# Patient Record
Sex: Female | Born: 1935 | Race: White | Hispanic: No | Marital: Married | Smoking: Never smoker
Health system: Southern US, Community
[De-identification: ages and names within clinical notes are randomized; demographics above are authoritative.]

## PROBLEM LIST (undated history)

## (undated) DIAGNOSIS — G459 Transient cerebral ischemic attack, unspecified: Secondary | ICD-10-CM

## (undated) DIAGNOSIS — B0221 Postherpetic geniculate ganglionitis: Secondary | ICD-10-CM

## (undated) DIAGNOSIS — C801 Malignant (primary) neoplasm, unspecified: Secondary | ICD-10-CM

## (undated) DIAGNOSIS — E7800 Pure hypercholesterolemia, unspecified: Secondary | ICD-10-CM

## (undated) DIAGNOSIS — G511 Geniculate ganglionitis: Secondary | ICD-10-CM

---

## 1998-03-21 ENCOUNTER — Other Ambulatory Visit: Admission: RE | Admit: 1998-03-21 | Discharge: 1998-03-21 | Payer: Self-pay | Admitting: Obstetrics and Gynecology

## 1998-04-30 ENCOUNTER — Emergency Department (HOSPITAL_COMMUNITY): Admission: EM | Admit: 1998-04-30 | Discharge: 1998-04-30 | Payer: Self-pay | Admitting: Emergency Medicine

## 1998-04-30 ENCOUNTER — Encounter: Payer: Self-pay | Admitting: Emergency Medicine

## 1998-08-03 ENCOUNTER — Ambulatory Visit (HOSPITAL_COMMUNITY): Admission: RE | Admit: 1998-08-03 | Discharge: 1998-08-03 | Payer: Self-pay | Admitting: Cardiology

## 1998-08-03 ENCOUNTER — Encounter: Payer: Self-pay | Admitting: Cardiology

## 1998-10-24 ENCOUNTER — Emergency Department (HOSPITAL_COMMUNITY): Admission: EM | Admit: 1998-10-24 | Discharge: 1998-10-24 | Payer: Self-pay | Admitting: Emergency Medicine

## 1998-10-25 ENCOUNTER — Emergency Department (HOSPITAL_COMMUNITY): Admission: EM | Admit: 1998-10-25 | Discharge: 1998-10-25 | Payer: Self-pay | Admitting: Emergency Medicine

## 1998-10-28 ENCOUNTER — Emergency Department (HOSPITAL_COMMUNITY): Admission: EM | Admit: 1998-10-28 | Discharge: 1998-10-28 | Payer: Self-pay | Admitting: Emergency Medicine

## 1998-10-29 ENCOUNTER — Emergency Department (HOSPITAL_COMMUNITY): Admission: EM | Admit: 1998-10-29 | Discharge: 1998-10-29 | Payer: Self-pay | Admitting: *Deleted

## 1999-03-08 ENCOUNTER — Emergency Department (HOSPITAL_COMMUNITY): Admission: EM | Admit: 1999-03-08 | Discharge: 1999-03-08 | Payer: Self-pay | Admitting: Emergency Medicine

## 1999-03-08 ENCOUNTER — Ambulatory Visit (HOSPITAL_COMMUNITY): Admission: RE | Admit: 1999-03-08 | Discharge: 1999-03-08 | Payer: Self-pay | Admitting: Emergency Medicine

## 1999-03-08 ENCOUNTER — Encounter: Payer: Self-pay | Admitting: Emergency Medicine

## 1999-03-19 ENCOUNTER — Ambulatory Visit (HOSPITAL_COMMUNITY): Admission: RE | Admit: 1999-03-19 | Discharge: 1999-03-19 | Payer: Self-pay | Admitting: Gastroenterology

## 1999-07-15 ENCOUNTER — Emergency Department (HOSPITAL_COMMUNITY): Admission: EM | Admit: 1999-07-15 | Discharge: 1999-07-15 | Payer: Self-pay | Admitting: Emergency Medicine

## 1999-07-16 ENCOUNTER — Encounter: Payer: Self-pay | Admitting: Emergency Medicine

## 1999-08-07 ENCOUNTER — Ambulatory Visit (HOSPITAL_COMMUNITY): Admission: RE | Admit: 1999-08-07 | Discharge: 1999-08-07 | Payer: Self-pay | Admitting: Neurology

## 1999-10-23 ENCOUNTER — Encounter: Payer: Self-pay | Admitting: Internal Medicine

## 1999-10-23 ENCOUNTER — Encounter: Admission: RE | Admit: 1999-10-23 | Discharge: 1999-10-23 | Payer: Self-pay | Admitting: Internal Medicine

## 2000-08-15 ENCOUNTER — Ambulatory Visit (HOSPITAL_BASED_OUTPATIENT_CLINIC_OR_DEPARTMENT_OTHER): Admission: RE | Admit: 2000-08-15 | Discharge: 2000-08-15 | Payer: Self-pay | Admitting: Plastic Surgery

## 2001-03-20 ENCOUNTER — Encounter: Payer: Self-pay | Admitting: Internal Medicine

## 2001-03-20 ENCOUNTER — Encounter: Admission: RE | Admit: 2001-03-20 | Discharge: 2001-03-20 | Payer: Self-pay | Admitting: Internal Medicine

## 2001-04-14 ENCOUNTER — Other Ambulatory Visit: Admission: RE | Admit: 2001-04-14 | Discharge: 2001-04-14 | Payer: Self-pay | Admitting: Obstetrics and Gynecology

## 2002-03-22 ENCOUNTER — Ambulatory Visit (HOSPITAL_COMMUNITY): Admission: RE | Admit: 2002-03-22 | Discharge: 2002-03-22 | Payer: Self-pay | Admitting: Internal Medicine

## 2002-03-22 ENCOUNTER — Encounter: Payer: Self-pay | Admitting: Internal Medicine

## 2003-03-24 ENCOUNTER — Ambulatory Visit (HOSPITAL_COMMUNITY): Admission: RE | Admit: 2003-03-24 | Discharge: 2003-03-24 | Payer: Self-pay | Admitting: Internal Medicine

## 2003-03-24 ENCOUNTER — Encounter: Payer: Self-pay | Admitting: Internal Medicine

## 2003-05-30 ENCOUNTER — Other Ambulatory Visit: Admission: RE | Admit: 2003-05-30 | Discharge: 2003-05-30 | Payer: Self-pay | Admitting: Obstetrics and Gynecology

## 2004-04-11 ENCOUNTER — Ambulatory Visit (HOSPITAL_COMMUNITY): Admission: RE | Admit: 2004-04-11 | Discharge: 2004-04-11 | Payer: Self-pay | Admitting: Internal Medicine

## 2004-05-03 ENCOUNTER — Ambulatory Visit (HOSPITAL_COMMUNITY): Admission: RE | Admit: 2004-05-03 | Discharge: 2004-05-03 | Payer: Self-pay | Admitting: Gastroenterology

## 2004-05-03 ENCOUNTER — Encounter (INDEPENDENT_AMBULATORY_CARE_PROVIDER_SITE_OTHER): Payer: Self-pay | Admitting: *Deleted

## 2004-06-26 ENCOUNTER — Other Ambulatory Visit: Admission: RE | Admit: 2004-06-26 | Discharge: 2004-06-26 | Payer: Self-pay | Admitting: Obstetrics and Gynecology

## 2007-09-19 ENCOUNTER — Emergency Department (HOSPITAL_COMMUNITY): Admission: EM | Admit: 2007-09-19 | Discharge: 2007-09-19 | Payer: Self-pay | Admitting: Emergency Medicine

## 2008-08-06 ENCOUNTER — Emergency Department (HOSPITAL_COMMUNITY): Admission: EM | Admit: 2008-08-06 | Discharge: 2008-08-06 | Payer: Self-pay | Admitting: Emergency Medicine

## 2009-03-26 ENCOUNTER — Emergency Department (HOSPITAL_COMMUNITY): Admission: EM | Admit: 2009-03-26 | Discharge: 2009-03-26 | Payer: Self-pay | Admitting: Emergency Medicine

## 2009-09-21 ENCOUNTER — Ambulatory Visit: Payer: Self-pay | Admitting: Internal Medicine

## 2009-09-21 ENCOUNTER — Inpatient Hospital Stay (HOSPITAL_COMMUNITY): Admission: EM | Admit: 2009-09-21 | Discharge: 2009-09-23 | Payer: Self-pay | Admitting: Emergency Medicine

## 2009-09-22 ENCOUNTER — Encounter (INDEPENDENT_AMBULATORY_CARE_PROVIDER_SITE_OTHER): Payer: Self-pay | Admitting: Internal Medicine

## 2010-10-19 LAB — COMPREHENSIVE METABOLIC PANEL
Alkaline Phosphatase: 38 U/L — ABNORMAL LOW (ref 39–117)
Calcium: 8.9 mg/dL (ref 8.4–10.5)
Creatinine, Ser: 0.72 mg/dL (ref 0.4–1.2)
GFR calc Af Amer: >60 mL/min (ref >60)
Potassium: 3.1 mEq/L — ABNORMAL LOW (ref 3.5–5.1)
Sodium: 138 mEq/L (ref 135–145)
Total Protein: 6.7 g/dL (ref 6.0–8.3)

## 2010-10-19 LAB — DIFFERENTIAL
Eosinophils Absolute: 0.1 10*3/uL (ref 0.0–0.7)
Monocytes Relative: 7 % (ref 3–12)
Neutro Abs: 3.8 10*3/uL (ref 1.7–7.7)

## 2010-10-19 LAB — CBC
HCT: 36.1 % (ref 36.0–46.0)
Hemoglobin: 12.4 g/dL (ref 12.0–15.0)
MCV: 85.5 fL (ref 78.0–100.0)
RBC: 4.22 MIL/uL (ref 3.87–5.11)
WBC: 5.4 10*3/uL (ref 4.0–10.5)

## 2010-10-19 LAB — PROTIME-INR
INR: 1.11 (ref 0.00–1.49)
Prothrombin Time: 14.2 seconds (ref 11.6–15.2)

## 2010-10-19 LAB — BASIC METABOLIC PANEL
BUN: 8 mg/dL (ref 6–23)
Calcium: 9.1 mg/dL (ref 8.4–10.5)
Creatinine, Ser: 0.7 mg/dL (ref 0.4–1.2)
Glucose, Bld: 104 mg/dL — ABNORMAL HIGH (ref 70–99)

## 2010-10-19 LAB — APTT: aPTT: 35 seconds (ref 24–37)

## 2010-10-19 LAB — URINALYSIS, ROUTINE W REFLEX MICROSCOPIC
Glucose, UA: NEGATIVE mg/dL
Ketones, ur: NEGATIVE mg/dL
Protein, ur: NEGATIVE mg/dL
Specific Gravity, Urine: 1.003 — ABNORMAL LOW (ref 1.005–1.030)
pH: 5.5 (ref 5.0–8.0)

## 2010-10-19 LAB — LIPID PANEL
Cholesterol: 123 mg/dL (ref 0–200)
HDL: 34 mg/dL — ABNORMAL LOW (ref >39)

## 2010-10-19 LAB — CK TOTAL AND CKMB (NOT AT ARMC)
CK, MB: 3.3 ng/mL (ref 0.3–4.0)
Total CK: 172 U/L (ref 7–177)

## 2010-10-19 LAB — HEMOGLOBIN A1C: Mean Plasma Glucose: 120 mg/dL

## 2010-12-14 NOTE — Op Note (Signed)
NAMELAURIEANN, Janet [name]                  ACCOUNT NO.:  [id number]   MEDICAL RECORD NO.:  [id number]          PATIENT TYPE:  AMB   LOCATION:  ENDO                         FACILITY:  [facility]   PHYSICIAN:  Petra Kuba, M.D.    DATE OF BIRTH:  22-Jan-1935   DATE OF PROCEDURE:  05/03/2004  DATE OF DISCHARGE:                                 OPERATIVE REPORT   PROCEDURE:  Colonoscopy with hot biopsy.   INDICATION:  Screening.  Consent was signed after risks, benefits, methods,  and options thoroughly discussed in the office in the past.   MEDICINES USED:  1.  Demerol 60.  2.  Versed 7.   DESCRIPTION OF PROCEDURE:  Rectal inspection was pertinent for external  hemorrhoids, small.  Digital exam was negative.  Video pediatric adjustable  colonoscope was inserted and fairly easily despite a tortuous sigmoid,  advanced to the cecum.  This did require some abdominal pressure but no  position changes.  On insertion, a rare left-sided diverticula was seen but  no other abnormalities.  The cecum was identified by the appendiceal orifice  and the ileocecal valve.  The scope was slowly withdrawn.  The prep was  adequate.  There was some liquid stool that required washing and suctioning.  On slow withdrawal through the colon, the cecum and the ascending were  normal.  In the more proximal transverse, a tiny polyp was seen and was hot  biopsied x 1.  The scope was slowly withdrawn.  Other than the tortuous  sigmoid with an occasional diverticula, no abnormalities were seen.  Once  back in the rectum, anorectal pull-through and retroflexion confirmed the  hemorrhoids.  The scope was straightened and readvanced a short ways up the  left side of the colon.  Air was suctioned, scope removed.  The patient  tolerated the procedure well.  There was no obvious immediate complication.   ENDOSCOPIC DIAGNOSES:  1.  Internal-external hemorrhoids.  2.  Sigmoid diverticulum and tortuosity.  3.  Proximal transverse  tiny polyp, hot biopsied.  4.  Otherwise, within normal limits to the cecum.   PLAN:  1.  Await pathology, but probably recheck on screening if doing well      medically in 5 years.  2.  Continue work-up with an EGD.      MEM/MEDQ  D:  05/03/2004  T:  05/03/2004  Job:  [id number]

## 2010-12-14 NOTE — Op Note (Signed)
[place]  Patient:    Janet [name], Janet [name]                         MRN: [id number] Proc. Date: 08/15/00 Adm. Date:  [id number] Disc. Date: [id number] Attending:  Chapman Moss                           Operative Report  PREOPERATIVE DIAGNOSIS:   Basal cell carcinoma of the right lower lid of 4 mm diameter.  POSTOPERATIVE DIAGNOSES: 1. Basal cell carcinoma of the right lower lid of 4 mm diameter. 2. Complex open wound right lower eye lid.  OPERATION: 1. Excision basal cell carcinoma right lower eyelid 4 mm diameter. 2. Lower eyelid reconstruction of complex open wound greater than    1.0 cm. 3. Lateral lower lid cantholysis.  SURGEON:  Teena Irani. Odis Luster, M.D.  ANESTHESIA:  General with 1% Xylocaine with epinephrine as local.  INDICATIONS:  [age]-year-old woman has a lesion on her lower lid. This is a biopsy-proven basal cell carcinoma.  It is approximately 4 mm in diameter.  The plan was to perform a shield type of excision of this lesion. It was felt that most likely this could be reconstructed since this would involve with margins approximately 25-30% of the lower lid.  A lateral lower limb cantholysis was also planned as a possibility to facilitate the lower lid closure.  The nature of this procedure, the risks and possible complications were discussed with her in detail including the possibility of further surgery being necessitated by any positive margins on the final pathology report.  She wished to proceed.  DESCRIPTION OF PROCEDURE:  The patient was taken to the operating room and placed supine.  After successful induction of anesthesia, she was prepped with Betadine and draped with sterile drapes.  The eyelid was irrigated thoroughly with BSF solution.  A Consormer was then thoroughly washed with BSF and then had Lacri-Lube placed on it and then placed in the eye for protection.  Then 1% Xylocaine with epinephrine local was  then infiltrated after the planned excision was carefully marked under loupe magnification.  The total limits of the excision was approximately 8-9 mm which meant this was somewhere between a 25-30% excision of her lower lid on measurement.  The excision was then performed taking great care to avoid damage to underlying eye.  This was a through and through excision.  The specimen was tagged at its medial border for the pathologist and sent as a permanent specimen.  The wound was irrigated thoroughly and meticulous hemostasis achieved using the needle tip electrocautery.  The closure with 6-0 chromic simple running suture for the conjunctivae keeping the knots in towards the wound itself.  Tarsal plate reapproximated with a 6-0 Prolene horizontal mattress suture.  The remainder of the closure with 6-0 silk interrupted sutures starting at the gray line and working inferiorly.  The first three sutures, the tails were left very long and these were then tied within the lower sutures in order to keep all of the suture material away from her eye. At the conclusion, it was felt that the lower lid closure was just a little bit tight.  She seemed to have to good closure of her lids but it seemed that there was about 1 mm of scleral show.  Due to this tight, a lateral cantholysis of the lower limb of the canthal  pinning was planned.  Then 1% Xylocaine without epinephrine was infiltrated at the lateral canthus.  An incision was made through the skin with a scissor and the underlying canthal tendon was identified.  The lower lid canthal tendon was identified and the upper lid canthal tendon left intact.  The division was then performed by cutting them approximately at 45 degree angle for the lower limb of the canthal tendon only.  This released the lower lid and allowed approximately a 4-5 mm advancement of the lower lid medially.  Thorough irrigation with BSF again and the closure with 6-0 Prolene  simple interrupted sutures.  She tolerated this procedure well.  Polysporin antibiotic ointment was applied to the wounds, and the eye again thoroughly irrigated with saline and some Lacrilube placed in it.  She was transferred to the recovery room in stable condition having tolerated the procedure well.  DISPOSITION:  The patient was checked in the recovery room and was found to have good extraocular eye movements and no visual complaints.  She denies any pain in the globe itself.  PLAN: 1. Darvocet-N 100 mg a total of 50 given one p.o. q.6h. p.r.n. for pain. 2. Polysporin antibiotic ointment for the wounds and Artificial Tears as    needed. 3. I would advise Lacrilube in the eye whenever sleeping. 4. No exercising, and we will recheck her in the office in about 10 days. DD:  08/15/00 TD:  08/17/00 Job: [id number]

## 2010-12-14 NOTE — Op Note (Signed)
[name], [name]                  ACCOUNT NO.:  [id number]   MEDICAL RECORD NO.:  [id number]          PATIENT TYPE:  AMB   LOCATION:  ENDO                         FACILITY:  [facility]   PHYSICIAN:  Petra Kuba, M.D.    DATE OF BIRTH:  16-Jun-1935   DATE OF PROCEDURE:  05/03/2004  DATE OF DISCHARGE:                                 OPERATIVE REPORT   PROCEDURE:  Esophagogastroduodenoscopy.   INDICATIONS:  Increasing upper tract symptoms, not responding to the usual  medicines. Consent was signed after risks, benefits, methods, and options  thoroughly discussed multiple times in the past.   MEDICINES USED:  Demerol 20, Versed 2 mg.   PROCEDURE NOTE:  The video endoscope was inserted by direct vision. Her  esophagus was tortuous. No signs of esophagitis were seen. She did have a  moderately enlarged hiatal hernia, widely patent ring just above it. Scope  passed into the stomach and advanced through a normal antrum and pylorus  into a normal duodenal bulb and around the C-loop to a normal second portion  of the duodenum. Scope was withdrawn back to the bulb, and a good look there  ruled out ulcers in that location. Scope was withdrawn back to the stomach  and retroflexed. The angularis, and lesser and greater curve were normal  except for hiatal hernia being confirmed in the cardia. Straight visualized  in the stomach did not reveal any additional findings. Air was suctioned.  Scope removed. Again, a good look at the esophagus was normal. Scope was  withdrawn. The patient tolerated the procedure well. There was no obvious or  immediate complication.   ENDOSCOPIC DIAGNOSES:  1.  Moderately enlarged hiatal hernia with a widely patent ring and tortuous      esophagus.  2.  Otherwise normal EGD.   PLAN:  Continued pump inhibitors, possibly go over to b.i.d. Happy to see  back p.r.n. Consider rechecking a gallbladder ultrasound just to make sure  gallstones not playing a role with her  symptoms.      MEM/MEDQ  D:  05/03/2004  T:  05/03/2004  Job:  [id number]   cc:   Larina Earthly, M.D.  [address]  [place]  [place] [address]  Fax: [phone]

## 2011-02-25 ENCOUNTER — Emergency Department (HOSPITAL_COMMUNITY)
Admission: EM | Admit: 2011-02-25 | Discharge: 2011-02-25 | Disposition: A | Discharge status: Home / Self Care | Payer: Medicare Other | Attending: Emergency Medicine | Admitting: Emergency Medicine

## 2011-02-25 ENCOUNTER — Emergency Department (HOSPITAL_COMMUNITY): Payer: Medicare Other

## 2011-02-25 DIAGNOSIS — M545 Low back pain, unspecified: Secondary | ICD-10-CM | POA: Insufficient documentation

## 2011-02-25 DIAGNOSIS — E785 Hyperlipidemia, unspecified: Secondary | ICD-10-CM | POA: Insufficient documentation

## 2011-02-25 DIAGNOSIS — X500XXA Overexertion from strenuous movement or load, initial encounter: Secondary | ICD-10-CM | POA: Insufficient documentation

## 2011-02-25 DIAGNOSIS — Z8673 Personal history of transient ischemic attack (TIA), and cerebral infarction without residual deficits: Secondary | ICD-10-CM | POA: Insufficient documentation

## 2011-02-25 DIAGNOSIS — K219 Gastro-esophageal reflux disease without esophagitis: Secondary | ICD-10-CM | POA: Insufficient documentation

## 2011-02-25 DIAGNOSIS — Z79899 Other long term (current) drug therapy: Secondary | ICD-10-CM | POA: Insufficient documentation

## 2011-02-25 DIAGNOSIS — S335XXA Sprain of ligaments of lumbar spine, initial encounter: Secondary | ICD-10-CM | POA: Insufficient documentation

## 2011-02-25 LAB — URINALYSIS, ROUTINE W REFLEX MICROSCOPIC
Bilirubin Urine: NEGATIVE
Nitrite: NEGATIVE
Protein, ur: NEGATIVE mg/dL
Specific Gravity, Urine: 1.008 (ref 1.005–1.030)
pH: 7.5 (ref 5.0–8.0)

## 2011-02-25 LAB — POCT I-STAT, CHEM 8
BUN: 13 mg/dL (ref 6–23)
Calcium, Ion: 1.25 mmol/L (ref 1.12–1.32)
Sodium: 142 mEq/L (ref 135–145)

## 2011-07-12 ENCOUNTER — Emergency Department (HOSPITAL_COMMUNITY): Payer: Medicare Other

## 2011-07-12 ENCOUNTER — Encounter: Payer: Self-pay | Admitting: *Deleted

## 2011-07-12 ENCOUNTER — Emergency Department (HOSPITAL_COMMUNITY)
Admission: EM | Admit: 2011-07-12 | Discharge: 2011-07-12 | Disposition: A | Discharge status: Home / Self Care | Payer: Medicare Other | Attending: Emergency Medicine | Admitting: Emergency Medicine

## 2011-07-12 DIAGNOSIS — G4459 Other complicated headache syndrome: Secondary | ICD-10-CM | POA: Insufficient documentation

## 2011-07-12 DIAGNOSIS — R112 Nausea with vomiting, unspecified: Secondary | ICD-10-CM | POA: Insufficient documentation

## 2011-07-12 DIAGNOSIS — E7800 Pure hypercholesterolemia, unspecified: Secondary | ICD-10-CM | POA: Insufficient documentation

## 2011-07-12 DIAGNOSIS — B0221 Postherpetic geniculate ganglionitis: Secondary | ICD-10-CM | POA: Insufficient documentation

## 2011-07-12 DIAGNOSIS — G459 Transient cerebral ischemic attack, unspecified: Secondary | ICD-10-CM | POA: Insufficient documentation

## 2011-07-12 DIAGNOSIS — F430 Acute stress reaction: Secondary | ICD-10-CM | POA: Insufficient documentation

## 2011-07-12 DIAGNOSIS — Z8673 Personal history of transient ischemic attack (TIA), and cerebral infarction without residual deficits: Secondary | ICD-10-CM | POA: Insufficient documentation

## 2011-07-12 HISTORY — DX: Postherpetic geniculate ganglionitis: B02.21

## 2011-07-12 HISTORY — DX: Geniculate ganglionitis: G51.1

## 2011-07-12 HISTORY — DX: Transient cerebral ischemic attack, unspecified: G45.9

## 2011-07-12 HISTORY — DX: Pure hypercholesterolemia, unspecified: E78.00

## 2011-07-12 MED ORDER — MORPHINE SULFATE 4 MG/ML IJ SOLN
4.0000 mg | Freq: Once | INTRAMUSCULAR | Status: AC
  Administered 2011-07-12: 4 mg via INTRAVENOUS
  Filled 2011-07-12: qty 1

## 2011-07-12 MED ORDER — ONDANSETRON HCL 4 MG/2ML IJ SOLN
4.0000 mg | Freq: Once | INTRAMUSCULAR | Status: AC
  Administered 2011-07-12: 4 mg via INTRAVENOUS
  Filled 2011-07-12 (×2): qty 2

## 2011-07-12 MED ORDER — DIAZEPAM 5 MG PO TABS: 5.0000 mg | ORAL_TABLET | Freq: Two times a day (BID) | ORAL | Status: AC | PRN

## 2011-07-12 MED ORDER — KETOROLAC TROMETHAMINE 30 MG/ML IJ SOLN
INTRAMUSCULAR | Status: AC
  Administered 2011-07-12: 15 mg
  Filled 2011-07-12: qty 1

## 2011-07-12 MED ORDER — KETOROLAC TROMETHAMINE 15 MG/ML IJ SOLN
15.0000 mg | Freq: Once | INTRAMUSCULAR | Status: DC
  Filled 2011-07-12: qty 1

## 2011-07-12 MED ORDER — DIPHENHYDRAMINE HCL 50 MG/ML IJ SOLN
25.0000 mg | Freq: Once | INTRAMUSCULAR | Status: AC
  Administered 2011-07-12: 25 mg via INTRAVENOUS
  Filled 2011-07-12: qty 1

## 2011-07-12 MED ORDER — METOCLOPRAMIDE HCL 5 MG/ML IJ SOLN
10.0000 mg | Freq: Once | INTRAMUSCULAR | Status: AC
  Administered 2011-07-12: 10 mg via INTRAVENOUS
  Filled 2011-07-12: qty 2

## 2011-07-12 NOTE — ED Notes (Signed)
Reports onset n/v, h/a since 1130 progressively worsening. Unable to describe pain. Denies any neuro defecits. No facial droop, slurred speech, dizziness, blurred vision. Report pain across forehead. -photophobia, emesis x1.

## 2011-07-12 NOTE — ED Notes (Signed)
Pt teary eyed, reports is under a lot of stress lately. Family at bedside confirmed

## 2011-07-12 NOTE — ED Notes (Signed)
C/o n/v, h/a onset 2 hrs ago. Pt reports h/a getting worse. Per EMS - IV, zofran 4mg  IV.

## 2011-07-12 NOTE — ED Notes (Signed)
Consulting MD at bedside

## 2011-07-12 NOTE — ED Provider Notes (Addendum)
History    76yF with cc of HA. Gradual onset around 1200 today. Frontal region without radiation. Feels like has been progressively getting worse. Denies trauma. No radiation. No neck pain or stiffness. No fever or chills. Initially nausea and vomited x1. Currently denies nausea but received zofran in route. No visual complaints. No significant HA history. Denies use of blood thinning medication. Denies hx of malignancy.   CSN: [id number] Arrival date & time: 07/12/2011  4:27 PM   First MD Initiated Contact with Patient 07/12/11 1641      Chief Complaint  Patient presents with  . Headache    (Consider location/radiation/quality/duration/timing/severity/associated sxs/prior treatment) HPI  Past Medical History  Diagnosis Date  . TIA (transient ischemic attack)   . High cholesterol   . Neuralgia facialis vera     History reviewed. No pertinent past surgical history.  No family history on file.  History  Substance Use Topics  . Smoking status: Never Smoker   . Smokeless tobacco: Not on file  . Alcohol Use: No    OB History    Grav Para Term Preterm Abortions TAB SAB Ect Mult Living                  Review of Systems   Review of symptoms negative unless otherwise noted in HPI.   Allergies  Aspirin; Penicillins; and Sulfa antibiotics  Home Medications  No current outpatient prescriptions on file.  Pulse 76  Temp(Src) 98.8 F (37.1 C) (Oral)  Resp 13  SpO2 99%  Physical Exam  Nursing note and vitals reviewed. Constitutional: She is oriented to person, place, and time. She appears well-developed and well-nourished. No distress.  HENT:  Head: Normocephalic and atraumatic.  Right Ear: External ear normal.  Left Ear: External ear normal.       No temporal tenderness.   Eyes: Conjunctivae are normal. Pupils are equal, round, and reactive to light. Right eye exhibits no discharge. Left eye exhibits no discharge.  Neck: Normal range of motion. Neck supple.    Cardiovascular: Normal rate, regular rhythm and normal heart sounds.  Exam reveals no gallop and no friction rub.   No murmur heard. Pulmonary/Chest: Effort normal and breath sounds normal. No respiratory distress.  Abdominal: Soft. She exhibits no distension. There is no tenderness.  Musculoskeletal: She exhibits no edema and no tenderness.  Lymphadenopathy:    She has no cervical adenopathy.  Neurological: She is alert and oriented to person, place, and time. No cranial nerve deficit. She exhibits normal muscle tone. Coordination normal.  Skin: Skin is warm and dry. She is not diaphoretic.  Psychiatric: She has a normal mood and affect. Her behavior is normal. Thought content normal.    ED Course  Procedures (including critical care time)  Labs Reviewed - No data to display Ct Head Wo Contrast  07/12/2011  *RADIOLOGY REPORT*  Clinical data:  headaches  CT HEAD WITHOUT CONTRAST  Technique:  Contiguous axial images were obtained from the base of the skull through the vertex without contrast.  Comparison: 09/22/2009  Findings: The brain has a normal appearance without evidence for hemorrhage, infarction, hydrocephalus, or mass lesion.  There is no extra axial fluid collection.  The skull and paranasal sinuses are normal.  IMPRESSION:  1.  No acute intracranial abnormalities.  Original Report Authenticated By: Rosealee Albee, M.D.    4:50 PM BP 158/82. L arm.  5:53 PM CT negative for acute pathology. Pt updated and no new complaints at this time.  Still reports HA, but improved. Is requesting additional medication. Additional meds ordered. Pt's age considered prior to ordering.  7:42 PM Long discussion had with pt prior to DC. Pt requesting something for "stress." Apparently has been under a lot for past couple months because of health issues with her sister. Discussed with pt that could prescribe something but does not address underlying issue. Apparently has already discussed with PCP.  Encouraged to speak with further.  1. Complicated headache syndromes   2. Nausea and vomiting       MDM  76yf with HA. Suspect primary HA. Consider SAH but gradual onset, nonfocal exam, no trauma, no neck pain or stiffness and negative CT preformed within 6 hours of symptom onset. Will defer from LP at this time. Considered other secondary causes such as giant cell arteritis, meningitis, mass, glaucoma, CO poisoning, etc but clinically doubt.  No HA prior to DC.           Raeford Razor, MD 07/14/11 1610  Raeford Razor, MD 07/14/11 [phone]

## 2011-07-12 NOTE — ED Notes (Signed)
Patient transported to CT 

## 2011-07-12 NOTE — ED Notes (Signed)
Informed patient and/or family of status.  

## 2011-07-20 ENCOUNTER — Encounter (HOSPITAL_COMMUNITY): Payer: Self-pay | Admitting: Cardiology

## 2011-07-20 ENCOUNTER — Emergency Department (HOSPITAL_COMMUNITY)
Admission: EM | Admit: 2011-07-20 | Discharge: 2011-07-20 | Disposition: A | Discharge status: Home / Self Care | Payer: Medicare Other | Attending: Emergency Medicine | Admitting: Emergency Medicine

## 2011-07-20 ENCOUNTER — Emergency Department (HOSPITAL_COMMUNITY): Payer: Medicare Other

## 2011-07-20 ENCOUNTER — Other Ambulatory Visit: Payer: Self-pay

## 2011-07-20 DIAGNOSIS — M25519 Pain in unspecified shoulder: Secondary | ICD-10-CM | POA: Insufficient documentation

## 2011-07-20 DIAGNOSIS — E789 Disorder of lipoprotein metabolism, unspecified: Secondary | ICD-10-CM | POA: Insufficient documentation

## 2011-07-20 DIAGNOSIS — R071 Chest pain on breathing: Secondary | ICD-10-CM | POA: Insufficient documentation

## 2011-07-20 DIAGNOSIS — Z79899 Other long term (current) drug therapy: Secondary | ICD-10-CM | POA: Insufficient documentation

## 2011-07-20 DIAGNOSIS — X58XXXA Exposure to other specified factors, initial encounter: Secondary | ICD-10-CM | POA: Insufficient documentation

## 2011-07-20 DIAGNOSIS — T148XXA Other injury of unspecified body region, initial encounter: Secondary | ICD-10-CM | POA: Insufficient documentation

## 2011-07-20 DIAGNOSIS — Z8673 Personal history of transient ischemic attack (TIA), and cerebral infarction without residual deficits: Secondary | ICD-10-CM | POA: Insufficient documentation

## 2011-07-20 HISTORY — DX: Malignant (primary) neoplasm, unspecified: C80.1

## 2011-07-20 LAB — CBC
Hemoglobin: 12.4 g/dL (ref 12.0–15.0)
MCHC: 34.7 g/dL (ref 30.0–36.0)
Platelets: 127 10*3/uL — ABNORMAL LOW (ref 150–400)
RBC: 4.36 MIL/uL (ref 3.87–5.11)

## 2011-07-20 LAB — COMPREHENSIVE METABOLIC PANEL
ALT: 17 U/L (ref 0–35)
AST: 23 U/L (ref 0–37)
Albumin: 3.9 g/dL (ref 3.5–5.2)
Alkaline Phosphatase: 55 U/L (ref 39–117)
BUN: 9 mg/dL (ref 6–23)
Chloride: 98 mEq/L (ref 96–112)
Potassium: 3.6 mEq/L (ref 3.5–5.1)
Sodium: 134 mEq/L — ABNORMAL LOW (ref 135–145)
Total Bilirubin: 0.4 mg/dL (ref 0.3–1.2)

## 2011-07-20 LAB — DIFFERENTIAL
Basophils Relative: 0 % (ref 0–1)
Monocytes Relative: 8 % (ref 3–12)
Neutro Abs: 2.7 10*3/uL (ref 1.7–7.7)
Neutrophils Relative %: 66 % (ref 43–77)

## 2011-07-20 LAB — CARDIAC PANEL(CRET KIN+CKTOT+MB+TROPI)
CK, MB: 5.8 ng/mL — ABNORMAL HIGH (ref 0.3–4.0)
Relative Index: 2.7 — ABNORMAL HIGH (ref 0.0–2.5)
Relative Index: 2.5 (ref 0.0–2.5)
Total CK: 258 U/L — ABNORMAL HIGH (ref 7–177)
Total CK: 236 U/L — ABNORMAL HIGH (ref 7–177)
Troponin I: <0.3 ng/mL (ref <0.30)

## 2011-07-20 MED ORDER — HYDROCODONE-ACETAMINOPHEN 5-325 MG PO TABS: 1.0000 | ORAL_TABLET | ORAL | Status: AC | PRN

## 2011-07-20 MED ORDER — MORPHINE SULFATE 2 MG/ML IJ SOLN
2.0000 mg | Freq: Once | INTRAMUSCULAR | Status: AC
  Administered 2011-07-20: 2 mg via INTRAVENOUS
  Filled 2011-07-20: qty 1

## 2011-07-20 MED ORDER — METHOCARBAMOL 500 MG PO TABS: 500.0000 mg | ORAL_TABLET | Freq: Two times a day (BID) | ORAL | Status: AC

## 2011-07-20 MED ORDER — IOHEXOL 300 MG/ML  SOLN
80.0000 mL | Freq: Once | INTRAMUSCULAR | Status: AC | PRN
  Administered 2011-07-20: 80 mL via INTRAVENOUS

## 2011-07-20 MED ORDER — SODIUM CHLORIDE 0.9 % IV SOLN
Freq: Once | INTRAVENOUS | Status: AC
  Administered 2011-07-20: 09:00:00 via INTRAVENOUS

## 2011-07-20 NOTE — ED Provider Notes (Signed)
History     CSN: [id number]  Arrival date & time 07/20/11  4098   First MD Initiated Contact with Patient 07/20/11 0827      Chief Complaint  Patient presents with  . Shoulder Pain    (Consider location/radiation/quality/duration/timing/severity/associated sxs/prior treatment) Patient is a 76 y.o. female presenting with shoulder pain. The history is provided by the patient.  Shoulder Pain   patient here with sudden onset of pleuritic left-sided chest discomfort radiating to her shoulder. Patient notes for shortness of breath with this, it denies any chest pressure, diaphoresis. No recent cold symptoms. Denies any leg pain or swelling no prior history of same. No medications taken prior to arrival. Deep breathing makes her symptoms worse. Patient's left shoulder is slightly tender with range of motion however she denies any trauma to it  Past Medical History  Diagnosis Date  . TIA (transient ischemic attack)   . High cholesterol   . Neuralgia facialis vera     History reviewed. No pertinent past surgical history.  History reviewed. No pertinent family history.  History  Substance Use Topics  . Smoking status: Never Smoker   . Smokeless tobacco: Not on file  . Alcohol Use: No    OB History    Grav Para Term Preterm Abortions TAB SAB Ect Mult Living                  Review of Systems  All other systems reviewed and are negative.    Allergies  Aspirin; Penicillins; and Sulfa antibiotics  Home Medications   Current Outpatient Rx  Name Route Sig Dispense Refill  . ATORVASTATIN CALCIUM 10 MG PO TABS Oral Take 10 mg by mouth daily.      Marland Kitchen CALCIUM PO Oral Take 1 tablet by mouth 2 (two) times daily.      . OMEGA-3 FATTY ACIDS 1000 MG PO CAPS Oral Take 1 g by mouth 2 (two) times daily.      Marland Kitchen GABAPENTIN 100 MG PO CAPS Oral Take 100 mg by mouth 3 (three) times daily.      Marland Kitchen DIAZEPAM 5 MG PO TABS Oral Take 1 tablet (5 mg total) by mouth every 12 (twelve) hours as  needed for anxiety. 10 tablet 0    BP 145/72  Pulse 84  Temp(Src) 98.2 F (36.8 C) (Oral)  Resp 17  SpO2 99%  Physical Exam  Nursing note and vitals reviewed. Constitutional: She is oriented to person, place, and time. She appears well-developed and well-nourished.  Non-toxic appearance. No distress.  HENT:  Head: Normocephalic and atraumatic.  Eyes: Conjunctivae, EOM and lids are normal. Pupils are equal, round, and reactive to light.  Neck: Normal range of motion. Neck supple. No tracheal deviation present. No mass present.  Cardiovascular: Normal rate, regular rhythm and normal heart sounds.  Exam reveals no gallop.   No murmur heard. Pulmonary/Chest: Effort normal and breath sounds normal. No stridor. No respiratory distress. She has no decreased breath sounds. She has no wheezes. She has no rhonchi. She has no rales.  Abdominal: Soft. Normal appearance and bowel sounds are normal. She exhibits no distension. There is no tenderness. There is no rebound and no CVA tenderness.  Musculoskeletal: She exhibits no edema and no tenderness.       Left shoulder: She exhibits pain. She exhibits normal range of motion and no deformity.  Neurological: She is alert and oriented to person, place, and time. She has normal strength. No cranial nerve deficit or  sensory deficit. GCS eye subscore is 4. GCS verbal subscore is 5. GCS motor subscore is 6.  Skin: Skin is warm and dry. No abrasion and no rash noted.  Psychiatric: She has a normal mood and affect. Her speech is normal and behavior is normal.    ED Course  Procedures (including critical care time)   Labs Reviewed  CBC  DIFFERENTIAL  COMPREHENSIVE METABOLIC PANEL  CARDIAC PANEL(CRET KIN+CKTOT+MB+TROPI)   No results found.   No diagnosis found.    MDM  pt had a chest CT was which was negative for pulmonary embolism. Cardiac enzymes x2 both of which were negative. Patient will be discharged home with medications for muscle  strain. Patient reexamined and has pinpoint tenderness at the left mid scapular area      Toy [place], MD 07/20/11 [phone]

## 2011-07-20 NOTE — ED Notes (Signed)
Dr. Freida Busman made aware of [facility] total

## 2011-07-20 NOTE — ED Notes (Signed)
Pt to department c/o left shoulder pain that started this morning after she got up from bed. Denies any chest pain, n/v or diaphoresis with the pain. Pt reports pain with inspiration. No acute distress noted.

## 2014-01-24 ENCOUNTER — Other Ambulatory Visit: Payer: Self-pay | Admitting: Internal Medicine

## 2014-01-24 DIAGNOSIS — Z1231 Encounter for screening mammogram for malignant neoplasm of breast: Secondary | ICD-10-CM

## 2014-03-02 ENCOUNTER — Ambulatory Visit: Payer: Medicare Other

## 2014-04-15 ENCOUNTER — Other Ambulatory Visit: Payer: Self-pay | Admitting: Physician Assistant

## 2015-06-05 ENCOUNTER — Encounter (HOSPITAL_COMMUNITY): Payer: Self-pay | Admitting: *Deleted

## 2015-06-05 ENCOUNTER — Emergency Department (HOSPITAL_COMMUNITY)
Admission: EM | Admit: 2015-06-05 | Discharge: 2015-06-05 | Disposition: A | Discharge status: Home / Self Care | Payer: Medicare Other | Source: Home / Self Care | Attending: Family Medicine | Admitting: Family Medicine

## 2015-06-05 ENCOUNTER — Emergency Department (INDEPENDENT_AMBULATORY_CARE_PROVIDER_SITE_OTHER): Payer: Medicare Other

## 2015-06-05 DIAGNOSIS — S8002XA Contusion of left knee, initial encounter: Secondary | ICD-10-CM | POA: Diagnosis not present

## 2015-06-05 DIAGNOSIS — S00511A Abrasion of lip, initial encounter: Secondary | ICD-10-CM

## 2015-06-05 DIAGNOSIS — W19XXXA Unspecified fall, initial encounter: Secondary | ICD-10-CM

## 2015-06-05 NOTE — ED Notes (Signed)
Pt  Reports  She  Felled       And  Injured          Her  l    Knee   And  Face     -  She  States  She  Tripped  Over   A  Brick      She  Reports   She  Did  Not black out       She  Has  An abrasion to  Her  Face  Swelling  Of  The  l  Knee    No  Vomiting  pearla

## 2015-06-05 NOTE — Discharge Instructions (Signed)
Bacitracin to lip as needed, ice and knee brace and tylenol for comfort until seen by orthopedist.

## 2015-06-05 NOTE — ED Provider Notes (Signed)
CSN: [id number]     Arrival date & time 06/05/15  1717 History   First MD Initiated Contact with Patient 06/05/15 1815     Chief Complaint  Patient presents with  . Fall   (Consider location/radiation/quality/duration/timing/severity/associated sxs/prior Treatment) Patient is a 80 y.o. female presenting with fall. The history is provided by the patient and a relative.  Fall This is a new problem. The current episode started 3 to 5 hours ago. The problem has not changed since onset.Associated symptoms comments: Tripped over brick coming out of [employer] striking upper lip and left knee on sidewalk, no loc, no nasal or dental injury..    Past Medical History  Diagnosis Date  . TIA (transient ischemic attack)   . High cholesterol   . Neuralgia facialis vera   . Cancer [facility])    History reviewed. No pertinent past surgical history. History reviewed. No pertinent family history. Social History  Substance Use Topics  . Smoking status: Never Smoker   . Smokeless tobacco: None  . Alcohol Use: No   OB History    No data available     Review of Systems  Constitutional: Negative.   Musculoskeletal: Positive for joint swelling and gait problem.  Skin: Positive for wound.  All other systems reviewed and are negative.   Allergies  Aspirin; Penicillins; and Sulfa antibiotics  Home Medications   Prior to Admission medications   Medication Sig Start Date End Date Taking? Authorizing Provider  atorvastatin (LIPITOR) 10 MG tablet Take 10 mg by mouth daily.      Historical Provider, MD  CALCIUM PO Take 1 tablet by mouth 2 (two) times daily.      Historical Provider, MD  fish oil-omega-3 fatty acids 1000 MG capsule Take 1 g by mouth 2 (two) times daily.      Historical Provider, MD  gabapentin (NEURONTIN) 100 MG capsule Take 100 mg by mouth 3 (three) times daily.      Historical Provider, MD   Meds Ordered and Administered this Visit  Medications - No data to display  BP 138/86  mmHg  Pulse 72  Temp(Src) 98.6 F (37 C) (Oral)  Resp 18  SpO2 100% No data found.   Physical Exam  Constitutional: She is oriented to person, place, and time. She appears well-developed and well-nourished. No distress.  HENT:  Head: Normocephalic and atraumatic.  Neck: Normal range of motion. Neck supple.  Musculoskeletal: She exhibits tenderness.       Left knee: She exhibits decreased range of motion, swelling, ecchymosis and bony tenderness.  Neurological: She is alert and oriented to person, place, and time.  Skin:  Skin flap abrasion to midline upper lip. No bleeding. Nose, teeth intact.  Nursing note and vitals reviewed. tetanus utd per pt.  ED Course  Procedures (including critical care time)  Labs Review Labs Reviewed - No data to display  Imaging Review Dg Knee Complete 4 Views Left  06/05/2015  CLINICAL DATA:  Status post fall today. Left knee pain and swelling. Initial encounter. EXAM: LEFT KNEE - COMPLETE 4+ VIEW COMPARISON:  None. FINDINGS: Soft tissue swelling is seen anterior to the patella consistent with contusion and/or hemorrhage into the the prepatellar bursa. There is no fracture. Degenerative change about the knee is most notable in the medial compartment. IMPRESSION: Contusion the anterior knee and/or hemorrhage into the prepatellar bursa. Negative for fracture. Osteoarthritis most notable about the medial compartment. Electronically Signed   By: Inge Rise M.D.  On: 06/05/2015 18:55    X-rays reviewed and report per radiologist.  Visual Acuity Review  Right Eye Distance:   Left Eye Distance:   Bilateral Distance:    Right Eye Near:   Left Eye Near:    Bilateral Near:         MDM   1. Abrasion of lip, initial encounter   2. Fall   3. Knee contusion, left, initial encounter        Billy Fischer, MD 06/05/15 Joen Laura

## 2017-08-26 ENCOUNTER — Ambulatory Visit (INDEPENDENT_AMBULATORY_CARE_PROVIDER_SITE_OTHER): Payer: Medicare Other | Admitting: Podiatry

## 2017-08-26 DIAGNOSIS — Q828 Other specified congenital malformations of skin: Secondary | ICD-10-CM | POA: Diagnosis not present

## 2017-08-26 DIAGNOSIS — M79675 Pain in left toe(s): Secondary | ICD-10-CM

## 2017-08-26 DIAGNOSIS — B351 Tinea unguium: Secondary | ICD-10-CM

## 2017-08-26 DIAGNOSIS — M79674 Pain in right toe(s): Secondary | ICD-10-CM

## 2017-08-27 NOTE — Progress Notes (Signed)
Subjective:   Patient ID: Ardine Eng, female   DOB: 83 y.o.   MRN: [id number]   HPI 83 year old female presents the office today with her caregiver for concerns of thick, painful, elongated toenails that she cannot trim herself.  Denies any redness or drainage coming from the area.  She also has a callus to the right foot along the inside aspect which is painful at times she states.  Again denies any redness or drainage.  No recent treatment.  She has no other concerns.   Review of Systems  All other systems reviewed and are negative.  Past Medical History:  Diagnosis Date  . Cancer ([place])   . High cholesterol   . Neuralgia facialis vera   . TIA (transient ischemic attack)     No past surgical history on file.   Current Outpatient Medications:  .  atorvastatin (LIPITOR) 10 MG tablet, Take 10 mg by mouth daily.  , Disp: , Rfl:  .  CALCIUM PO, Take 1 tablet by mouth 2 (two) times daily.  , Disp: , Rfl:  .  fish oil-omega-3 fatty acids 1000 MG capsule, Take 1 g by mouth 2 (two) times daily.  , Disp: , Rfl:  .  gabapentin (NEURONTIN) 100 MG capsule, Take 100 mg by mouth 3 (three) times daily.  , Disp: , Rfl:   Allergies  Allergen Reactions  . Aspirin Nausea And Vomiting  . Penicillins Hives  . Sulfa Antibiotics Nausea And Vomiting    Social History   Socioeconomic History  . Marital status: Married    Spouse name: Not on file  . Number of children: Not on file  . Years of education: Not on file  . Highest education level: Not on file  Social Needs  . Financial resource strain: Not on file  . Food insecurity - worry: Not on file  . Food insecurity - inability: Not on file  . Transportation needs - medical: Not on file  . Transportation needs - non-medical: Not on file  Occupational History  . Not on file  Tobacco Use  . Smoking status: Never Smoker  Substance and Sexual Activity  . Alcohol use: No  . Drug use: No  . Sexual activity: Not on file  Other Topics  Concern  . Not on file  Social History Narrative  . Not on file         Objective:  Physical Exam  General: NAD  Dermatological: Skin is warm, dry and supple bilateral. Nails x 10 are well manicured; remaining integument appears unremarkable at this time.  Hyperkeratotic lesion right medial first MPJ plantarly.  No underlying ulceration, drainage or any signs of infection.  There are no open sores, no preulcerative lesions, no rash or signs of infection present.  Vascular: Dorsalis Pedis artery and Posterior Tibial artery pedal pulses are 2/4 bilateral with immedate capillary fill time. There is no pain with calf compression, swelling, warmth, erythema.   Neruologic: Grossly intact via light touch bilateral. Protective threshold with Semmes Wienstein monofilament intact to all pedal sites bilateral.   Musculoskeletal: No gross boney pedal deformities bilateral. No pain, crepitus, or limitation noted with foot and ankle range of motion bilateral. Muscular strength 5/5 in all groups tested bilateral.     Assessment:   Symptomatic hyperkeratotic lesion right foot with symptomatic onychomycosis     Plan:  -Treatment options discussed including all alternatives, risks, and complications -Etiology of symptoms were discussed -Nails debrided 10 without complications or bleeding. -Hyperkeratotic lesion  Sharpy debrided x1 without any complications or bleeding -Moisturizer to the feet daily.  Did not applied interdigitally. -Daily foot inspection -Follow-up in 3 months or sooner if any problems arise. In the meantime, encouraged to call the office with any questions, concerns, change in symptoms.   Celesta Gentile, DPM

## 2017-11-25 ENCOUNTER — Ambulatory Visit: Payer: Medicare Other | Admitting: Podiatry

## 2020-01-14 ENCOUNTER — Emergency Department (HOSPITAL_COMMUNITY): Payer: Medicare Other

## 2020-01-14 ENCOUNTER — Encounter (HOSPITAL_COMMUNITY): Payer: Self-pay | Admitting: Emergency Medicine

## 2020-01-14 ENCOUNTER — Inpatient Hospital Stay (HOSPITAL_COMMUNITY)
Admission: EM | Admit: 2020-01-14 | Discharge: 2020-01-20 | DRG: 522 | Disposition: A | Discharge status: Skilled Nursing Facility | Payer: Medicare Other | Source: Skilled Nursing Facility | Attending: Internal Medicine | Admitting: Internal Medicine

## 2020-01-14 ENCOUNTER — Other Ambulatory Visit: Payer: Self-pay

## 2020-01-14 DIAGNOSIS — R32 Unspecified urinary incontinence: Secondary | ICD-10-CM | POA: Diagnosis present

## 2020-01-14 DIAGNOSIS — F0390 Unspecified dementia without behavioral disturbance: Secondary | ICD-10-CM

## 2020-01-14 DIAGNOSIS — E041 Nontoxic single thyroid nodule: Secondary | ICD-10-CM | POA: Diagnosis present

## 2020-01-14 DIAGNOSIS — F0280 Dementia in other diseases classified elsewhere without behavioral disturbance: Secondary | ICD-10-CM | POA: Diagnosis not present

## 2020-01-14 DIAGNOSIS — I10 Essential (primary) hypertension: Secondary | ICD-10-CM | POA: Diagnosis present

## 2020-01-14 DIAGNOSIS — G511 Geniculate ganglionitis: Secondary | ICD-10-CM | POA: Diagnosis present

## 2020-01-14 DIAGNOSIS — G301 Alzheimer's disease with late onset: Secondary | ICD-10-CM | POA: Diagnosis not present

## 2020-01-14 DIAGNOSIS — S72001A Fracture of unspecified part of neck of right femur, initial encounter for closed fracture: Secondary | ICD-10-CM

## 2020-01-14 DIAGNOSIS — Z20822 Contact with and (suspected) exposure to covid-19: Secondary | ICD-10-CM | POA: Diagnosis present

## 2020-01-14 DIAGNOSIS — E7800 Pure hypercholesterolemia, unspecified: Secondary | ICD-10-CM | POA: Diagnosis present

## 2020-01-14 DIAGNOSIS — Z886 Allergy status to analgesic agent status: Secondary | ICD-10-CM | POA: Diagnosis not present

## 2020-01-14 DIAGNOSIS — S72011A Unspecified intracapsular fracture of right femur, initial encounter for closed fracture: Principal | ICD-10-CM | POA: Diagnosis present

## 2020-01-14 DIAGNOSIS — F39 Unspecified mood [affective] disorder: Secondary | ICD-10-CM | POA: Diagnosis present

## 2020-01-14 DIAGNOSIS — Y92129 Unspecified place in nursing home as the place of occurrence of the external cause: Secondary | ICD-10-CM | POA: Diagnosis not present

## 2020-01-14 DIAGNOSIS — Z8673 Personal history of transient ischemic attack (TIA), and cerebral infarction without residual deficits: Secondary | ICD-10-CM

## 2020-01-14 DIAGNOSIS — Z79899 Other long term (current) drug therapy: Secondary | ICD-10-CM | POA: Diagnosis not present

## 2020-01-14 DIAGNOSIS — Z880 Allergy status to penicillin: Secondary | ICD-10-CM

## 2020-01-14 DIAGNOSIS — D696 Thrombocytopenia, unspecified: Secondary | ICD-10-CM | POA: Diagnosis present

## 2020-01-14 DIAGNOSIS — E785 Hyperlipidemia, unspecified: Secondary | ICD-10-CM | POA: Diagnosis present

## 2020-01-14 DIAGNOSIS — S72009A Fracture of unspecified part of neck of unspecified femur, initial encounter for closed fracture: Secondary | ICD-10-CM

## 2020-01-14 DIAGNOSIS — S0081XA Abrasion of other part of head, initial encounter: Secondary | ICD-10-CM | POA: Diagnosis present

## 2020-01-14 DIAGNOSIS — D62 Acute posthemorrhagic anemia: Secondary | ICD-10-CM | POA: Diagnosis not present

## 2020-01-14 DIAGNOSIS — Z9181 History of falling: Secondary | ICD-10-CM | POA: Diagnosis not present

## 2020-01-14 DIAGNOSIS — Z882 Allergy status to sulfonamides status: Secondary | ICD-10-CM | POA: Diagnosis not present

## 2020-01-14 DIAGNOSIS — Z7189 Other specified counseling: Secondary | ICD-10-CM | POA: Diagnosis not present

## 2020-01-14 DIAGNOSIS — Z66 Do not resuscitate: Secondary | ICD-10-CM | POA: Diagnosis present

## 2020-01-14 DIAGNOSIS — W19XXXA Unspecified fall, initial encounter: Secondary | ICD-10-CM | POA: Diagnosis not present

## 2020-01-14 DIAGNOSIS — W1830XA Fall on same level, unspecified, initial encounter: Secondary | ICD-10-CM | POA: Diagnosis present

## 2020-01-14 DIAGNOSIS — S72351A Displaced comminuted fracture of shaft of right femur, initial encounter for closed fracture: Secondary | ICD-10-CM | POA: Diagnosis not present

## 2020-01-14 DIAGNOSIS — Z96649 Presence of unspecified artificial hip joint: Secondary | ICD-10-CM

## 2020-01-14 LAB — BASIC METABOLIC PANEL
Anion gap: 11 (ref 5–15)
BUN: 28 mg/dL — ABNORMAL HIGH (ref 8–23)
CO2: 27 mmol/L (ref 22–32)
Calcium: 8.8 mg/dL — ABNORMAL LOW (ref 8.9–10.3)
Chloride: 104 mmol/L (ref 98–111)
Creatinine, Ser: 0.9 mg/dL (ref 0.44–1.00)
GFR calc Af Amer: >60 mL/min (ref >60)
GFR calc non Af Amer: 60 mL/min — ABNORMAL LOW (ref >60)
Glucose, Bld: 144 mg/dL — ABNORMAL HIGH (ref 70–99)
Potassium: 3.5 mmol/L (ref 3.5–5.1)
Sodium: 142 mmol/L (ref 135–145)

## 2020-01-14 LAB — CBC WITH DIFFERENTIAL/PLATELET
Abs Immature Granulocytes: 0.05 10*3/uL (ref 0.00–0.07)
Basophils Absolute: 0 10*3/uL (ref 0.0–0.1)
Basophils Relative: 0 %
Eosinophils Absolute: 0.1 10*3/uL (ref 0.0–0.5)
Eosinophils Relative: 1 %
HCT: 35.1 % — ABNORMAL LOW (ref 36.0–46.0)
Hemoglobin: 11.5 g/dL — ABNORMAL LOW (ref 12.0–15.0)
Immature Granulocytes: 1 %
Lymphocytes Relative: 19 %
Lymphs Abs: 1.9 10*3/uL (ref 0.7–4.0)
MCH: 29.7 pg (ref 26.0–34.0)
MCHC: 32.8 g/dL (ref 30.0–36.0)
MCV: 90.7 fL (ref 80.0–100.0)
Monocytes Absolute: 0.6 10*3/uL (ref 0.1–1.0)
Monocytes Relative: 5 %
Neutro Abs: 7.7 10*3/uL (ref 1.7–7.7)
Neutrophils Relative %: 74 %
Platelets: 123 10*3/uL — ABNORMAL LOW (ref 150–400)
RBC: 3.87 MIL/uL (ref 3.87–5.11)
RDW: 12.4 % (ref 11.5–15.5)
WBC: 10.4 10*3/uL (ref 4.0–10.5)
nRBC: 0 % (ref 0.0–0.2)

## 2020-01-14 LAB — PROTIME-INR
INR: 1 (ref 0.8–1.2)
Prothrombin Time: 13 seconds (ref 11.4–15.2)

## 2020-01-14 LAB — SARS CORONAVIRUS 2 BY RT PCR (HOSPITAL ORDER, PERFORMED IN ~~LOC~~ HOSPITAL LAB): SARS Coronavirus 2: NEGATIVE

## 2020-01-14 LAB — TYPE AND SCREEN
ABO/RH(D): O NEG
Antibody Screen: NEGATIVE

## 2020-01-14 LAB — ABO/RH: ABO/RH(D): O NEG

## 2020-01-14 MED ORDER — ADULT MULTIVITAMIN W/MINERALS CH
1.0000 | ORAL_TABLET | Freq: Every day | ORAL | Status: DC
  Administered 2020-01-16 – 2020-01-20 (×5): 1 via ORAL
  Filled 2020-01-14 (×5): qty 1

## 2020-01-14 MED ORDER — DONEPEZIL HCL 10 MG PO TABS
10.0000 mg | ORAL_TABLET | Freq: Two times a day (BID) | ORAL | Status: DC
  Administered 2020-01-16 – 2020-01-20 (×10): 10 mg via ORAL
  Filled 2020-01-14 (×11): qty 1

## 2020-01-14 MED ORDER — ALUM & MAG HYDROXIDE-SIMETH 200-200-20 MG/5ML PO SUSP: 30.0000 mL | Freq: Four times a day (QID) | ORAL | Status: DC | PRN

## 2020-01-14 MED ORDER — ZINC SULFATE 220 (50 ZN) MG PO CAPS
220.0000 mg | ORAL_CAPSULE | Freq: Every day | ORAL | Status: DC
  Administered 2020-01-16 – 2020-01-20 (×5): 220 mg via ORAL
  Filled 2020-01-14 (×5): qty 1

## 2020-01-14 MED ORDER — HYDROCODONE-ACETAMINOPHEN 5-325 MG PO TABS: 1.0000 | ORAL_TABLET | Freq: Four times a day (QID) | ORAL | Status: DC | PRN

## 2020-01-14 MED ORDER — MIRTAZAPINE 15 MG PO TABS
7.5000 mg | ORAL_TABLET | Freq: Every day | ORAL | Status: DC
  Administered 2020-01-16 – 2020-01-20 (×5): 7.5 mg via ORAL
  Filled 2020-01-14 (×6): qty 1

## 2020-01-14 MED ORDER — MORPHINE SULFATE (PF) 2 MG/ML IV SOLN
0.5000 mg | INTRAVENOUS | Status: DC | PRN
  Administered 2020-01-15 (×2): 0.5 mg via INTRAVENOUS
  Filled 2020-01-14 (×2): qty 1

## 2020-01-14 MED ORDER — FENTANYL CITRATE (PF) 100 MCG/2ML IJ SOLN
50.0000 ug | INTRAMUSCULAR | Status: AC | PRN
  Administered 2020-01-14 (×2): 50 ug via INTRAVENOUS
  Filled 2020-01-14 (×2): qty 2

## 2020-01-14 MED ORDER — DIVALPROEX SODIUM 250 MG PO DR TAB
250.0000 mg | DELAYED_RELEASE_TABLET | Freq: Two times a day (BID) | ORAL | Status: DC
  Administered 2020-01-15 – 2020-01-20 (×11): 250 mg via ORAL
  Filled 2020-01-14 (×12): qty 1

## 2020-01-14 MED ORDER — METOPROLOL SUCCINATE ER 25 MG PO TB24
25.0000 mg | ORAL_TABLET | Freq: Every day | ORAL | Status: DC
  Administered 2020-01-15 – 2020-01-20 (×6): 25 mg via ORAL
  Filled 2020-01-14 (×7): qty 1

## 2020-01-14 MED ORDER — ESCITALOPRAM OXALATE 10 MG PO TABS
10.0000 mg | ORAL_TABLET | Freq: Every day | ORAL | Status: DC
  Administered 2020-01-16 – 2020-01-20 (×5): 10 mg via ORAL
  Filled 2020-01-14 (×5): qty 1

## 2020-01-14 MED ORDER — MAGNESIUM HYDROXIDE 400 MG/5ML PO SUSP: 30.0000 mL | Freq: Every evening | ORAL | Status: DC | PRN

## 2020-01-14 MED ORDER — ONDANSETRON HCL 4 MG/2ML IJ SOLN
4.0000 mg | Freq: Once | INTRAMUSCULAR | Status: AC
  Administered 2020-01-14: 4 mg via INTRAVENOUS
  Filled 2020-01-14: qty 2

## 2020-01-14 MED ORDER — VITAMIN D 25 MCG (1000 UNIT) PO TABS
1000.0000 [IU] | ORAL_TABLET | Freq: Every day | ORAL | Status: DC
  Administered 2020-01-16 – 2020-01-20 (×5): 1000 [IU] via ORAL
  Filled 2020-01-14 (×5): qty 1

## 2020-01-14 MED ORDER — DARIFENACIN HYDROBROMIDE ER 7.5 MG PO TB24
7.5000 mg | ORAL_TABLET | Freq: Every day | ORAL | Status: DC
  Administered 2020-01-16 – 2020-01-20 (×5): 7.5 mg via ORAL
  Filled 2020-01-14 (×6): qty 1

## 2020-01-14 MED ORDER — SODIUM CHLORIDE 0.9 % IV SOLN: INTRAVENOUS | Status: AC

## 2020-01-14 MED ORDER — ATORVASTATIN CALCIUM 40 MG PO TABS
40.0000 mg | ORAL_TABLET | Freq: Every evening | ORAL | Status: DC
  Administered 2020-01-15 – 2020-01-20 (×5): 40 mg via ORAL
  Filled 2020-01-14 (×5): qty 1

## 2020-01-14 NOTE — ED Provider Notes (Signed)
[facility] DEPT Provider Note   CSN: [id number] Arrival date & time: 01/14/20  2025     History Chief Complaint  Patient presents with  . Fall  . Hip Injury    right    Janet [name] is a 85 y.o. female.  Level 5 caveat secondary to dementia.  She is here from [facility] after a witnessed fall in the hallway.  Does not know why she fell.  Not on any blood thinners.  Complaining of severe right hip pain.  Denies any other complaints.  The history is provided by the patient and the EMS personnel.  Fall This is a new problem. The current episode started less than 1 hour ago. The problem has not changed since onset.Pertinent negatives include no chest pain, no abdominal pain, no headaches and no shortness of breath. The symptoms are aggravated by bending and twisting. Nothing relieves the symptoms. She has tried nothing for the symptoms. The treatment provided no relief.       Past Medical History:  Diagnosis Date  . Cancer ([place])   . High cholesterol   . Neuralgia facialis vera   . TIA (transient ischemic attack)     Patient Active Problem List   Diagnosis Date Noted  . TIA (transient ischemic attack)   . High cholesterol   . Neuralgia facialis vera     History reviewed. No pertinent surgical history.   OB History   No obstetric history on file.     No family history on file.  Social History   Tobacco Use  . Smoking status: Never Smoker  . Smokeless tobacco: Never Used  Substance Use Topics  . Alcohol use: No  . Drug use: No    Home Medications Prior to Admission medications   Medication Sig Start Date End Date Taking? Authorizing Provider  atorvastatin (LIPITOR) 10 MG tablet Take 10 mg by mouth daily.      [provider]  CALCIUM PO Take 1 tablet by mouth 2 (two) times daily.      [provider]  fish oil-omega-3 fatty acids 1000 MG capsule Take 1 g by mouth 2 (two) times daily.      [provider]  gabapentin (NEURONTIN) 100 MG capsule Take 100 mg by mouth 3 (three) times daily.      [provider]    Allergies    Aspirin, Penicillins, and Sulfa antibiotics  Review of Systems   Review of Systems  Unable to perform ROS: Dementia  Respiratory: Negative for shortness of breath.   Cardiovascular: Negative for chest pain.  Gastrointestinal: Negative for abdominal pain.  Neurological: Negative for headaches.    Physical Exam Updated Vital Signs BP 136/86 (BP Location: Left Arm)   Pulse 64   Temp 98 F (36.7 C)   Resp 13   SpO2 100%   Physical Exam Vitals and nursing note reviewed.  Constitutional:      General: She is not in acute distress.    Appearance: Normal appearance. She is well-developed.  HENT:     Head: Normocephalic.     Comments: Small abrasion left forehead. Eyes:     Conjunctiva/sclera: Conjunctivae normal.  Neck:     Comments: Trach midline patient in cervical collar.  No obvious cervical step-offs. Cardiovascular:     Rate and Rhythm: Normal rate and regular rhythm.     Heart sounds: No murmur heard.   Pulmonary:     Effort: Pulmonary effort is normal.  No respiratory distress.     Breath sounds: Normal breath sounds.  Abdominal:     Palpations: Abdomen is soft.     Tenderness: There is no abdominal tenderness. There is no guarding or rebound.  Musculoskeletal:        General: Tenderness and signs of injury present.     Comments: Pelvis stable tender right hip.  Noted shortening and external rotation.  Knee ankle no gross deformities.  Left lower extremity no gross deformities.  Upper extremities full range of motion without any pain or limitations.  Skin:    General: Skin is warm and dry.     Capillary Refill: Capillary refill takes less than 2 seconds.  Neurological:     General: No focal deficit present.     Mental Status: She is alert. Mental status is at baseline. She is disoriented.     Comments: Patient oriented  to self only.  Moving all extremities without any obvious focal deficits.     ED Results / Procedures / Treatments   Labs (all labs ordered are listed, but only abnormal results are displayed) Labs Reviewed  BASIC METABOLIC PANEL - Abnormal; Notable for the following components:      Result Value   Glucose, Bld 144 (*)    BUN 28 (*)    Calcium 8.8 (*)    GFR calc non Af Amer 60 (*)    All other components within normal limits  CBC WITH DIFFERENTIAL/PLATELET - Abnormal; Notable for the following components:   Hemoglobin 11.5 (*)    HCT 35.1 (*)    Platelets 123 (*)    All other components within normal limits  SARS CORONAVIRUS 2 BY RT PCR (HOSPITAL ORDER, [facility])  PROTIME-INR  TSH  TYPE AND SCREEN  ABO/RH  TYPE AND SCREEN    EKG EKG Interpretation  Date/Time:  Friday January 14 2020 20:56:10 EDT Ventricular Rate:  61 PR Interval:    QRS Duration: 86 QT Interval:  485 QTC Calculation: 489 R Axis:   20 Text Interpretation: Sinus rhythm Borderline prolonged QT interval ? u waves new from prior 12/12 Confirmed by Aletta Edouard [phone]) on 01/14/2020 8:58:42 PM   Radiology DG Chest 1 View  Result Date: 01/14/2020 CLINICAL DATA:  Status post fall. EXAM: CHEST  1 VIEW COMPARISON:  None. FINDINGS: There is no evidence of acute infiltrate, pleural effusion or pneumothorax. The heart size and mediastinal contours are within normal limits. The visualized skeletal structures are unremarkable. IMPRESSION: No active disease. Electronically Signed   By: Virgina Norfolk M.D.   On: 01/14/2020 21:28   CT HEAD WO CONTRAST  Result Date: 01/14/2020 CLINICAL DATA:  Witnessed fall today.  Dementia patient. EXAM: CT HEAD WITHOUT CONTRAST TECHNIQUE: Contiguous axial images were obtained from the base of the skull through the vertex without intravenous contrast. COMPARISON:  Most recent head CT 07/12/2011 FINDINGS: Brain: No intracranial hemorrhage, mass effect, or  midline shift. Moderate generalized atrophy and chronic small vessel ischemia. Remote lacunar infarct in the left basal ganglia. No hydrocephalus. The basilar cisterns are patent. No evidence of territorial infarct or acute ischemia. No extra-axial or intracranial fluid collection. Vascular: Atherosclerosis of skullbase vasculature without hyperdense vessel or abnormal calcification. Skull: No fracture or focal lesion. Sinuses/Orbits: Mucosal thickening of the ethmoid air cells and right greater than left maxillary sinus. Left mastoid air cells are hypo pneumatized. No acute findings. Left lens extraction. Other: None. IMPRESSION: 1. No acute intracranial abnormality. No skull  fracture. 2. Generalized atrophy and chronic small vessel ischemia. Remote lacunar infarct in the left basal ganglia. Electronically Signed   By: Keith Rake M.D.   On: 01/14/2020 21:44   CT CERVICAL SPINE WO CONTRAST  Result Date: 01/14/2020 CLINICAL DATA:  Witnessed fall today.  Dementia patient. EXAM: CT CERVICAL SPINE WITHOUT CONTRAST TECHNIQUE: Multidetector CT imaging of the cervical spine was performed without intravenous contrast. Multiplanar CT image reconstructions were also generated. COMPARISON:  None. FINDINGS: Alignment: Trace anterolisthesis of C7 on T1 is likely facet mediated on degenerative. No traumatic subluxation. No jumped or perched facets. C1 is well aligned with C2. Skull base and vertebrae: No acute fracture. Vertebral body heights are maintained. The dens and skull base are intact. Soft tissues and spinal canal: No prevertebral fluid or swelling. No visible canal hematoma. Disc levels: Mild diffuse degenerative disc disease with disc space narrowing and endplate spurring. There is multilevel facet hypertrophy. Upper chest: 17 mm hypodense nodule in the left lobe of the thyroid gland. No acute findings. Other: Carotid calcifications. IMPRESSION: 1. Mild degenerative change in the cervical spine without acute  fracture or subluxation. 2. A 17 mm hypodense nodule in the left lobe of the thyroid gland. In the setting of significant comorbidities or limited life expectancy, no follow-up recommended (ref: J Am Coll Radiol. 2015 Feb;12(2): 143-50). Electronically Signed   By: Keith Rake M.D.   On: 01/14/2020 21:47   CT HIP RIGHT WO CONTRAST  Result Date: 01/14/2020 CLINICAL DATA:  Right hip fracture EXAM: CT OF THE RIGHT HIP WITHOUT CONTRAST TECHNIQUE: Multidetector CT imaging of the right hip was performed according to the standard protocol. Multiplanar CT image reconstructions were also generated. COMPARISON:  Radiograph same day FINDINGS: Bones/Joint/Cartilage There is a comminuted impacted fracture of the transcervical right femoral neck. There is superior and anterior subluxation with angulation of the femoral shaft. The femoral head is still well seated within the acetabulum. There is small fracture fragment seen adjacent to the femoral neck. A sclerotic foci seen within the inferior femoral head. There is diffuse osteopenia. No other definite fractures are identified. Ligaments Suboptimally assessed by CT. Muscles and Tendons The muscles surrounding the hip normal appearance without evidence of focal atrophy or tear. The visualized portions of the tendons appear to be grossly intact. Soft tissues Soft tissue swelling seen over the lateral aspect of the hip. Colonic diverticula are noted without diverticulitis. IMPRESSION: Comminuted impacted displaced anteriorly angulated transcervical right femoral neck fracture. Electronically Signed   By: Prudencio Pair M.D.   On: 01/14/2020 23:02   DG Hip Unilat  With Pelvis 2-3 Views Right  Result Date: 01/14/2020 CLINICAL DATA:  Status post fall. EXAM: DG HIP (WITH OR WITHOUT PELVIS) 2-3V RIGHT COMPARISON:  None. FINDINGS: Acute fracture deformity is seen extending through the neck of the proximal right femur. Approximately 1/2 shaft width superior displacement of the  distal fracture site is seen. There is no evidence of dislocation. There is no evidence of significant arthropathy or other focal bone abnormality. IMPRESSION: Acute fracture of the proximal right femur. Electronically Signed   By: Virgina Norfolk M.D.   On: 01/14/2020 21:26   DG FEMUR, MIN 2 VIEWS RIGHT  Result Date: 01/14/2020 CLINICAL DATA:  Status post fall. EXAM: RIGHT FEMUR 2 VIEWS COMPARISON:  None. FINDINGS: Acute fracture deformity is seen extending through the neck of the proximal right femur. Approximately 1/2 shaft width superior displacement of the distal fracture site is noted. There is no evidence of  associated dislocation. Soft tissue structures are unremarkable. IMPRESSION: Acute fracture of the proximal right femur. Electronically Signed   By: Virgina Norfolk M.D.   On: 01/14/2020 21:27    Procedures Procedures (including critical care time)  Medications Ordered in ED Medications  atorvastatin (LIPITOR) tablet 40 mg ( Oral Automatically Held 01/23/20 1800)  metoprolol succinate (TOPROL-XL) 24 hr tablet 25 mg ( Oral Automatically Held 01/23/20 1000)  donepezil (ARICEPT) tablet 10 mg ( Oral Automatically Held 01/30/20 2200)  escitalopram (LEXAPRO) tablet 10 mg ( Oral Automatically Held 01/23/20 1000)  mirtazapine (REMERON) tablet 7.5 mg ( Oral Automatically Held 01/30/20 2200)  alum & mag hydroxide-simeth (MAALOX/MYLANTA) 200-200-20 MG/5ML suspension 30 mL ( Oral MAR Hold 01/15/20 0752)  magnesium hydroxide (MILK OF MAGNESIA) suspension 30 mL ( Oral MAR Hold 01/15/20 0752)  darifenacin (ENABLEX) 24 hr tablet 7.5 mg ( Oral Automatically Held 01/23/20 1000)  divalproex (DEPAKOTE) DR tablet 250 mg ( Oral Automatically Held 01/30/20 2200)  cholecalciferol (VITAMIN D3) tablet 1,000 Units ( Oral Automatically Held 01/23/20 1000)  multivitamin with minerals tablet 1 tablet ( Oral Automatically Held 01/23/20 1000)  zinc sulfate capsule 220 mg ( Oral Automatically Held 01/23/20 1000)    HYDROcodone-acetaminophen (NORCO/VICODIN) 5-325 MG per tablet 1-2 tablet ( Oral MAR Hold 01/15/20 0752)  morphine 2 MG/ML injection 0.5 mg ( Intravenous MAR Hold 01/15/20 0752)  0.9 %  sodium chloride infusion ( Intravenous New Bag/Given 01/15/20 0156)  fentaNYL (SUBLIMAZE) injection 25-50 mcg (has no administration in time range)  ceFAZolin (ANCEF) IVPB 2g/100 mL premix (has no administration in time range)  ceFAZolin (ANCEF) 2-4 GM/100ML-% IVPB (has no administration in time range)  tranexamic acid (CYKLOKAPRON) 2,000 mg in sodium chloride 0.9 % 50 mL Topical Application (has no administration in time range)  fentaNYL (SUBLIMAZE) injection 50 mcg (50 mcg Intravenous Given 01/14/20 2235)  ondansetron (ZOFRAN) injection 4 mg (4 mg Intravenous Given 01/14/20 2221)    ED Course  I have reviewed the triage vital signs and the nursing notes.  Pertinent labs & imaging results that were available during my care of the patient were reviewed by me and considered in my medical decision making (see chart for details).  Clinical Course as of Jan 15 1035  Fri Jan 14, 2020  2217 Patient's x-rays ordered and interpreted by me as acute right femoral neck fracture.  Chest x-ray not showing any acute disease.  CT head and cervical spine showing atrophy no acute fracture or bleed.   [MB]  2239 I was able to reach the patient's power of attorney Kaylyn Layer who states she has not her daughter although is listed like that on the demographics.  There are no family members.  I updated her on the results of her work-up and the hip fracture and the need for her to be admitted to [facility] for likely surgery tomorrow.  She said she would call back in the morning to find out what She was on and to go be with her.   [MB]  2240 Discussed with [place] [place] [facility] who recommends that she be admitted to the [facility] for anticipated surgery for tomorrow.  Hospitalist has been paged.   [MB]  2254 Discussed with Dr.  Marlowe Sax [facility] hospitalist will evaluate the patient for admission.   [MB]    Clinical Course User Index [MB] Hayden Rasmussen, MD   MDM Rules/Calculators/A&P  This patient complains of fall and right hip pain; this involves an extensive number of treatment Options and is a complaint that carries with it a high risk of complications and Morbidity. The differential includes hip fracture, intracranial bleed, skull fracture, cervical fracture, metabolic derangement, anemia of infection  I ordered, reviewed and interpreted labs, which included CBC with normal white count, stable hemoglobin, slightly low platelets.  Chemistries fairly unremarkable with an elevated glucose and slightly low calcium.  Normal coags.  Covid testing negative. I ordered medication fentanyl with improvement in her pain. I ordered imaging studies which included chest x-ray pelvis and right hip along with a CAT scan of the head and cervical spine and I independently    visualized and interpreted imaging which showed acute right femoral neck fracture.  Head CT and cervical spine CT do not show any acute findings. Additional history obtained from patient's power of attorney and EMS Previous records obtained and reviewed in epic I consulted Dr. Erlinda Hong orthopedics and Dr. Marlowe Sax [facility] hospitalist and discussed lab and imaging findings  Critical Interventions: None  After the interventions stated above, I reevaluated the patient and found patient to be hemodynamically stable.  Will need admission to the hospital for further management of her hip fracture.  Pain is adequately controlled at this time.   Final Clinical Impression(s) / ED Diagnoses Final diagnoses:  Closed fracture of right hip, initial encounter [facility])    Rx / DC Orders ED Discharge Orders    None       Hayden Rasmussen, MD 01/15/20 1039

## 2020-01-14 NOTE — ED Triage Notes (Addendum)
Patient arrives via EMS from [facility] s/p fall. Patient had a witnessed fall in the hallway. Unknown if she hit her head. No blood thinners, no head pain. Patient complaining of right hip pain, deformity noted. Patient is dementia, mostly non-verbal. Per staff, patient asks for snacks and says Hello and that is her baseline.

## 2020-01-14 NOTE — H&P (Signed)
History and Physical    Janet [name] UKG:[id number] DOB: 22-Nov-1934 DOA: 01/14/2020  PCP: Prince Solian, MD Patient coming from: [facility]  Chief Complaint: Fall, right hip pain  HPI: Janet [name] is a 85 y.o. female with medical history significant of dementia, hyperlipidemia, TIA presenting to the ED from her nursing home for evaluation of right hip pain after having a witnessed mechanical fall. She is not on any blood thinners.  Patient is oriented to self only.  States "Please don't mess with my leg, it's going to make me cry."  No additional history could be obtained from her.  ED Course: Afebrile.  Labs showing no leukocytosis.  Hemoglobin 11.5, not significantly changed from baseline.  Platelet count 1203k, at baseline.  No significant electrolyte derangements on metabolic panel.  Creatinine 0.9, close to baseline.  INR 1.0.  Screening SARS-CoV-2 PCR test pending.  Chest x-ray showing no active disease.  X-ray showing acute fracture of the proximal right femur.  Head CT negative for acute intracranial abnormality.  CT C-spine negative for acute fracture or subluxation.  Showing a 17 mm hypodense nodule in the left lobe of the thyroid gland.  ED provider discussed the case with Dr. Erlinda Hong from orthopedics who requested transferring the patient to [facility] for surgery in the morning.  Review of Systems:  All systems reviewed and apart from history of presenting illness, are negative.  Past Medical History:  Diagnosis Date  . Cancer ([place])   . High cholesterol   . Neuralgia facialis vera   . TIA (transient ischemic attack)     History reviewed. No pertinent surgical history.   reports that she has never smoked. She has never used smokeless tobacco. She reports that she does not drink alcohol and does not use drugs.  Allergies  Allergen Reactions  . Aspirin Nausea And Vomiting  . Penicillins Hives  . Sulfa Antibiotics Nausea And Vomiting    No family history on  file.  Prior to Admission medications   Medication Sig Start Date End Date Taking? Authorizing Provider  acetaminophen (TYLENOL) 325 MG tablet Take 650 mg by mouth in the morning and at bedtime.   Yes [provider]  acetaminophen (TYLENOL) 500 MG tablet Take 500 mg by mouth every 6 (six) hours as needed for moderate pain.   Yes [provider]  alum & mag hydroxide-simeth (MYLANTA) 200-200-20 MG/5ML suspension Take 30 mLs by mouth every 6 (six) hours as needed for indigestion or heartburn.   Yes [provider]  Ascorbic Acid (VITAMIN C PO) Take 500 mg by mouth in the morning and at bedtime.   Yes [provider]  atorvastatin (LIPITOR) 40 MG tablet Take 40 mg by mouth every evening.   Yes [provider]  cholecalciferol (VITAMIN D3) 25 MCG (1000 UNIT) tablet Take 1,000 Units by mouth daily.   Yes [provider]  divalproex (DEPAKOTE) 250 MG DR tablet Take 250 mg by mouth 2 (two) times daily.   Yes [provider]  donepezil (ARICEPT) 10 MG tablet Take 10 mg by mouth in the morning and at bedtime.   Yes [provider]  escitalopram (LEXAPRO) 10 MG tablet Take 10 mg by mouth daily.   Yes [provider]  guaiFENesin (ROBITUSSIN) 100 MG/5ML liquid Take 200 mg by mouth 4 (four) times daily as needed for cough or congestion.   Yes [provider]  loperamide (IMODIUM) 2 MG capsule Take 2 mg by mouth as needed for diarrhea  or loose stools.   Yes [provider]  magnesium hydroxide (MILK OF MAGNESIA) 400 MG/5ML suspension Take 30 mLs by mouth at bedtime as needed for mild constipation.   Yes [provider]  metoprolol succinate (TOPROL-XL) 25 MG 24 hr tablet Take 25 mg by mouth daily.   Yes [provider]  mirtazapine (REMERON) 7.5 MG tablet Take 7.5 mg by mouth at bedtime.   Yes [provider]  Multiple Vitamins-Minerals (WOMENS 50+ MULTI VITAMIN/MIN) TABS Take 1 tablet  by mouth daily.   Yes [provider]  solifenacin (VESICARE) 5 MG tablet Take 5 mg by mouth daily.   Yes [provider]  zinc gluconate 50 MG tablet Take 50 mg by mouth in the morning and at bedtime.   Yes [provider]    Physical Exam: Vitals:   01/14/20 2031 01/14/20 2049 01/14/20 2215  BP:  (!) 152/137 (!) 170/72  Pulse:  (!) 59 64  Resp:  17 15  Temp:  99.2 F (37.3 C)   TempSrc:  Oral   SpO2: 94% 99% 90%    Physical Exam Constitutional:      General: She is not in acute distress. HENT:     Head: Normocephalic.  Eyes:     Extraocular Movements: Extraocular movements intact.  Cardiovascular:     Rate and Rhythm: Normal rate and regular rhythm.     Pulses: Normal pulses.  Pulmonary:     Effort: Pulmonary effort is normal. No respiratory distress.     Breath sounds: Normal breath sounds. No wheezing or rales.  Abdominal:     General: Bowel sounds are normal. There is no distension.     Palpations: Abdomen is soft.     Tenderness: There is no abdominal tenderness. There is no guarding.  Musculoskeletal:        General: Deformity present.     Cervical back: Neck supple.     Comments: Right lower extremity externally rotated  Skin:    General: Skin is warm and dry.  Neurological:     Mental Status: She is alert.     Comments: Oriented to self only     Labs on Admission: I have personally reviewed following labs and imaging studies  CBC: Recent Labs  Lab 01/14/20 2149  WBC 10.4  NEUTROABS 7.7  HGB 11.5*  HCT 35.1*  MCV 90.7  PLT 630*   Basic Metabolic Panel: Recent Labs  Lab 01/14/20 2149  NA 142  K 3.5  CL 104  CO2 27  GLUCOSE 144*  BUN 28*  CREATININE 0.90  CALCIUM 8.8*   GFR: CrCl cannot be calculated (Unknown ideal weight.). Liver Function Tests: No results for input(s): AST, ALT, ALKPHOS, BILITOT, PROT, ALBUMIN in the last 168 hours. No results for input(s): LIPASE, AMYLASE in the last 168 hours. No results  for input(s): AMMONIA in the last 168 hours. Coagulation Profile: Recent Labs  Lab 01/14/20 2149  INR 1.0   Cardiac Enzymes: No results for input(s): CKTOTAL, CKMB, CKMBINDEX, TROPONINI in the last 168 hours. BNP (last 3 results) No results for input(s): PROBNP in the last 8760 hours. HbA1C: No results for input(s): HGBA1C in the last 72 hours. CBG: No results for input(s): GLUCAP in the last 168 hours. Lipid Profile: No results for input(s): CHOL, HDL, LDLCALC, TRIG, CHOLHDL, LDLDIRECT in the last 72 hours. Thyroid Function Tests: No results for input(s): TSH, T4TOTAL, FREET4, T3FREE, THYROIDAB in the last 72 hours. Anemia Panel: No results for  input(s): VITAMINB12, FOLATE, FERRITIN, TIBC, IRON, RETICCTPCT in the last 72 hours. Urine analysis:    Component Value Date/Time   COLORURINE YELLOW 02/25/2011 1021   APPEARANCEUR CLEAR 02/25/2011 1021   LABSPEC 1.008 02/25/2011 1021   PHURINE 7.5 02/25/2011 1021   GLUCOSEU NEGATIVE 02/25/2011 1021   HGBUR NEGATIVE 02/25/2011 1021   BILIRUBINUR NEGATIVE 02/25/2011 1021   KETONESUR NEGATIVE 02/25/2011 1021   PROTEINUR NEGATIVE 02/25/2011 1021   UROBILINOGEN 0.2 02/25/2011 1021   NITRITE NEGATIVE 02/25/2011 1021   LEUKOCYTESUR NEGATIVE 02/25/2011 1021    Radiological Exams on Admission: DG Chest 1 View  Result Date: 01/14/2020 CLINICAL DATA:  Status post fall. EXAM: CHEST  1 VIEW COMPARISON:  None. FINDINGS: There is no evidence of acute infiltrate, pleural effusion or pneumothorax. The heart size and mediastinal contours are within normal limits. The visualized skeletal structures are unremarkable. IMPRESSION: No active disease. Electronically Signed   By: Virgina Norfolk M.D.   On: 01/14/2020 21:28   CT HEAD WO CONTRAST  Result Date: 01/14/2020 CLINICAL DATA:  Witnessed fall today.  Dementia patient. EXAM: CT HEAD WITHOUT CONTRAST TECHNIQUE: Contiguous axial images were obtained from the base of the skull through the vertex  without intravenous contrast. COMPARISON:  Most recent head CT 07/12/2011 FINDINGS: Brain: No intracranial hemorrhage, mass effect, or midline shift. Moderate generalized atrophy and chronic small vessel ischemia. Remote lacunar infarct in the left basal ganglia. No hydrocephalus. The basilar cisterns are patent. No evidence of territorial infarct or acute ischemia. No extra-axial or intracranial fluid collection. Vascular: Atherosclerosis of skullbase vasculature without hyperdense vessel or abnormal calcification. Skull: No fracture or focal lesion. Sinuses/Orbits: Mucosal thickening of the ethmoid air cells and right greater than left maxillary sinus. Left mastoid air cells are hypo pneumatized. No acute findings. Left lens extraction. Other: None. IMPRESSION: 1. No acute intracranial abnormality. No skull fracture. 2. Generalized atrophy and chronic small vessel ischemia. Remote lacunar infarct in the left basal ganglia. Electronically Signed   By: Keith Rake M.D.   On: 01/14/2020 21:44   CT CERVICAL SPINE WO CONTRAST  Result Date: 01/14/2020 CLINICAL DATA:  Witnessed fall today.  Dementia patient. EXAM: CT CERVICAL SPINE WITHOUT CONTRAST TECHNIQUE: Multidetector CT imaging of the cervical spine was performed without intravenous contrast. Multiplanar CT image reconstructions were also generated. COMPARISON:  None. FINDINGS: Alignment: Trace anterolisthesis of C7 on T1 is likely facet mediated on degenerative. No traumatic subluxation. No jumped or perched facets. C1 is well aligned with C2. Skull base and vertebrae: No acute fracture. Vertebral body heights are maintained. The dens and skull base are intact. Soft tissues and spinal canal: No prevertebral fluid or swelling. No visible canal hematoma. Disc levels: Mild diffuse degenerative disc disease with disc space narrowing and endplate spurring. There is multilevel facet hypertrophy. Upper chest: 17 mm hypodense nodule in the left lobe of the  thyroid gland. No acute findings. Other: Carotid calcifications. IMPRESSION: 1. Mild degenerative change in the cervical spine without acute fracture or subluxation. 2. A 17 mm hypodense nodule in the left lobe of the thyroid gland. In the setting of significant comorbidities or limited life expectancy, no follow-up recommended (ref: J Am Coll Radiol. 2015 Feb;12(2): 143-50). Electronically Signed   By: Keith Rake M.D.   On: 01/14/2020 21:47   CT HIP RIGHT WO CONTRAST  Result Date: 01/14/2020 CLINICAL DATA:  Right hip fracture EXAM: CT OF THE RIGHT HIP WITHOUT CONTRAST TECHNIQUE: Multidetector CT imaging of the right hip was performed according to  the standard protocol. Multiplanar CT image reconstructions were also generated. COMPARISON:  Radiograph same day FINDINGS: Bones/Joint/Cartilage There is a comminuted impacted fracture of the transcervical right femoral neck. There is superior and anterior subluxation with angulation of the femoral shaft. The femoral head is still well seated within the acetabulum. There is small fracture fragment seen adjacent to the femoral neck. A sclerotic foci seen within the inferior femoral head. There is diffuse osteopenia. No other definite fractures are identified. Ligaments Suboptimally assessed by CT. Muscles and Tendons The muscles surrounding the hip normal appearance without evidence of focal atrophy or tear. The visualized portions of the tendons appear to be grossly intact. Soft tissues Soft tissue swelling seen over the lateral aspect of the hip. Colonic diverticula are noted without diverticulitis. IMPRESSION: Comminuted impacted displaced anteriorly angulated transcervical right femoral neck fracture. Electronically Signed   By: Prudencio Pair M.D.   On: 01/14/2020 23:02   DG Hip Unilat  With Pelvis 2-3 Views Right  Result Date: 01/14/2020 CLINICAL DATA:  Status post fall. EXAM: DG HIP (WITH OR WITHOUT PELVIS) 2-3V RIGHT COMPARISON:  None. FINDINGS: Acute  fracture deformity is seen extending through the neck of the proximal right femur. Approximately 1/2 shaft width superior displacement of the distal fracture site is seen. There is no evidence of dislocation. There is no evidence of significant arthropathy or other focal bone abnormality. IMPRESSION: Acute fracture of the proximal right femur. Electronically Signed   By: Virgina Norfolk M.D.   On: 01/14/2020 21:26   DG FEMUR, MIN 2 VIEWS RIGHT  Result Date: 01/14/2020 CLINICAL DATA:  Status post fall. EXAM: RIGHT FEMUR 2 VIEWS COMPARISON:  None. FINDINGS: Acute fracture deformity is seen extending through the neck of the proximal right femur. Approximately 1/2 shaft width superior displacement of the distal fracture site is noted. There is no evidence of associated dislocation. Soft tissue structures are unremarkable. IMPRESSION: Acute fracture of the proximal right femur. Electronically Signed   By: Virgina Norfolk M.D.   On: 01/14/2020 21:27    EKG: Independently reviewed.  Sinus rhythm.  Assessment/Plan Principal Problem:   Closed displaced fracture of right femoral neck (HCC) Active Problems:   High cholesterol   Thyroid nodule   Thrombocytopenia (HCC)   Dementia (HCC)   Right hip fracture secondary to a mechanical fall: X-ray showing acute fracture of the proximal right femur. ED provider discussed the case with Dr. Erlinda Hong from orthopedics who requested transferring the patient to [facility] for surgery in the morning. -Keep n.p.o. Gentle IV fluid hydration.  Morphine as needed for pain, Norco as needed.  Bowel regimen.  Bedrest, nonweightbearing.  Thyroid nodule: CT showing a 17 mm hypodense nodule in the left lobe of the thyroid gland. -Thyroid ultrasound ordered for further evaluation, check TSH level  Chronic thrombocytopenia: Platelet count 1203k, at baseline.   Hyperlipidemia -Continue Lipitor  Mood disorder -Continue Depakote  Dementia -Continue Aricept.  Follow delirium  precautions.  DVT prophylaxis: SCDs Code Status: Full code Family Communication: No family available at this time. Disposition Plan: Status is: Inpatient  Remains inpatient appropriate because:IV treatments appropriate due to intensity of illness or inability to take PO, Inpatient level of care appropriate due to severity of illness and Hip fracture needs surgery   Dispo: The patient is from: Nursing home              Anticipated d/c is to: SNF              Anticipated  d/c date is: > 3 days              Patient currently is not medically stable to d/c.  The medical decision making on this patient was of high complexity and the patient is at high risk for clinical deterioration, therefore this is a level 3 visit.  Shela Leff MD [facility]  If 7PM-7AM, please contact night-coverage [email or web address]  01/14/2020, 11:09 PM

## 2020-01-15 ENCOUNTER — Inpatient Hospital Stay (HOSPITAL_COMMUNITY): Payer: Medicare Other | Admitting: Anesthesiology

## 2020-01-15 ENCOUNTER — Encounter (HOSPITAL_COMMUNITY): Payer: Self-pay | Admitting: Internal Medicine

## 2020-01-15 ENCOUNTER — Inpatient Hospital Stay (HOSPITAL_COMMUNITY): Payer: Medicare Other

## 2020-01-15 ENCOUNTER — Other Ambulatory Visit: Payer: Self-pay

## 2020-01-15 ENCOUNTER — Encounter (HOSPITAL_COMMUNITY)
Admission: EM | Disposition: A | Discharge status: Skilled Nursing Facility | Payer: Self-pay | Source: Skilled Nursing Facility | Attending: Family Medicine

## 2020-01-15 DIAGNOSIS — Z7189 Other specified counseling: Secondary | ICD-10-CM

## 2020-01-15 DIAGNOSIS — G301 Alzheimer's disease with late onset: Secondary | ICD-10-CM

## 2020-01-15 DIAGNOSIS — S72001A Fracture of unspecified part of neck of right femur, initial encounter for closed fracture: Secondary | ICD-10-CM

## 2020-01-15 DIAGNOSIS — F0280 Dementia in other diseases classified elsewhere without behavioral disturbance: Secondary | ICD-10-CM

## 2020-01-15 DIAGNOSIS — W19XXXA Unspecified fall, initial encounter: Secondary | ICD-10-CM

## 2020-01-15 DIAGNOSIS — Y92129 Unspecified place in nursing home as the place of occurrence of the external cause: Secondary | ICD-10-CM

## 2020-01-15 DIAGNOSIS — E7800 Pure hypercholesterolemia, unspecified: Secondary | ICD-10-CM

## 2020-01-15 DIAGNOSIS — E041 Nontoxic single thyroid nodule: Secondary | ICD-10-CM

## 2020-01-15 DIAGNOSIS — D696 Thrombocytopenia, unspecified: Secondary | ICD-10-CM

## 2020-01-15 DIAGNOSIS — S72351A Displaced comminuted fracture of shaft of right femur, initial encounter for closed fracture: Secondary | ICD-10-CM

## 2020-01-15 HISTORY — PX: ANTERIOR APPROACH HEMI HIP ARTHROPLASTY: SHX6690

## 2020-01-15 LAB — ABO/RH: ABO/RH(D): O NEG

## 2020-01-15 LAB — TYPE AND SCREEN
ABO/RH(D): O NEG
Antibody Screen: NEGATIVE

## 2020-01-15 LAB — CREATININE, SERUM
Creatinine, Ser: 0.93 mg/dL (ref 0.44–1.00)
GFR calc Af Amer: >60 mL/min (ref >60)
GFR calc non Af Amer: 57 mL/min — ABNORMAL LOW (ref >60)

## 2020-01-15 LAB — CBC
HCT: 31.6 % — ABNORMAL LOW (ref 36.0–46.0)
Hemoglobin: 10.3 g/dL — ABNORMAL LOW (ref 12.0–15.0)
MCH: 29.7 pg (ref 26.0–34.0)
MCHC: 32.6 g/dL (ref 30.0–36.0)
MCV: 91.1 fL (ref 80.0–100.0)
Platelets: 112 10*3/uL — ABNORMAL LOW (ref 150–400)
RBC: 3.47 MIL/uL — ABNORMAL LOW (ref 3.87–5.11)
RDW: 12.7 % (ref 11.5–15.5)
WBC: 10.1 10*3/uL (ref 4.0–10.5)
nRBC: 0 % (ref 0.0–0.2)

## 2020-01-15 SURGERY — HEMIARTHROPLASTY, HIP, DIRECT ANTERIOR APPROACH, FOR FRACTURE
Anesthesia: General | Laterality: Right

## 2020-01-15 MED ORDER — METHOCARBAMOL 500 MG PO TABS
500.0000 mg | ORAL_TABLET | Freq: Four times a day (QID) | ORAL | Status: DC | PRN
  Administered 2020-01-17: 500 mg via ORAL
  Filled 2020-01-15 (×2): qty 1

## 2020-01-15 MED ORDER — CEFAZOLIN SODIUM-DEXTROSE 2-4 GM/100ML-% IV SOLN
INTRAVENOUS | Status: AC
  Filled 2020-01-15: qty 100

## 2020-01-15 MED ORDER — EPHEDRINE SULFATE-NACL 50-0.9 MG/10ML-% IV SOSY
PREFILLED_SYRINGE | INTRAVENOUS | Status: DC | PRN
  Administered 2020-01-15: 10 mg via INTRAVENOUS

## 2020-01-15 MED ORDER — TRANEXAMIC ACID 1000 MG/10ML IV SOLN: 2000.0000 mg | INTRAVENOUS | Status: DC

## 2020-01-15 MED ORDER — POLYETHYLENE GLYCOL 3350 17 G PO PACK: 17.0000 g | PACK | Freq: Every day | ORAL | Status: DC | PRN

## 2020-01-15 MED ORDER — GLYCOPYRROLATE 0.2 MG/ML IJ SOLN
INTRAMUSCULAR | Status: DC | PRN
Start: 2020-01-15 — End: 2020-01-15
  Administered 2020-01-15: .2 mg via INTRAVENOUS

## 2020-01-15 MED ORDER — ENOXAPARIN SODIUM 40 MG/0.4ML ~~LOC~~ SOLN
40.0000 mg | SUBCUTANEOUS | Status: DC
  Administered 2020-01-16 – 2020-01-20 (×5): 40 mg via SUBCUTANEOUS
  Filled 2020-01-15 (×5): qty 0.4

## 2020-01-15 MED ORDER — PROPOFOL 10 MG/ML IV BOLUS
INTRAVENOUS | Status: AC
  Filled 2020-01-15: qty 20

## 2020-01-15 MED ORDER — PROPOFOL 10 MG/ML IV BOLUS
INTRAVENOUS | Status: DC | PRN
Start: 2020-01-15 — End: 2020-01-15
  Administered 2020-01-15: 60 mg via INTRAVENOUS

## 2020-01-15 MED ORDER — POVIDONE-IODINE 10 % EX SWAB: 2.0000 "application " | Freq: Once | CUTANEOUS | Status: DC

## 2020-01-15 MED ORDER — ROCURONIUM BROMIDE 100 MG/10ML IV SOLN
INTRAVENOUS | Status: DC | PRN
  Administered 2020-01-15: 40 mg via INTRAVENOUS

## 2020-01-15 MED ORDER — PHENOL 1.4 % MT LIQD: 1.0000 | OROMUCOSAL | Status: DC | PRN

## 2020-01-15 MED ORDER — TRANEXAMIC ACID 1000 MG/10ML IV SOLN
2000.0000 mg | Freq: Once | INTRAVENOUS | Status: DC
  Filled 2020-01-15: qty 20

## 2020-01-15 MED ORDER — VASOPRESSIN 20 UNIT/ML IV SOLN
INTRAVENOUS | Status: AC
  Filled 2020-01-15: qty 1

## 2020-01-15 MED ORDER — ALUM & MAG HYDROXIDE-SIMETH 200-200-20 MG/5ML PO SUSP: 30.0000 mL | ORAL | Status: DC | PRN

## 2020-01-15 MED ORDER — CEFAZOLIN SODIUM-DEXTROSE 2-3 GM-%(50ML) IV SOLR
INTRAVENOUS | Status: DC | PRN
  Administered 2020-01-15: 2 g via INTRAVENOUS

## 2020-01-15 MED ORDER — ONDANSETRON HCL 4 MG PO TABS: 4.0000 mg | ORAL_TABLET | Freq: Four times a day (QID) | ORAL | Status: DC | PRN

## 2020-01-15 MED ORDER — HYDROCODONE-ACETAMINOPHEN 5-325 MG PO TABS: 1.0000 | ORAL_TABLET | Freq: Three times a day (TID) | ORAL | 0 refills | Status: DC | PRN

## 2020-01-15 MED ORDER — CEFAZOLIN SODIUM-DEXTROSE 2-4 GM/100ML-% IV SOLN: 2.0000 g | Freq: Once | INTRAVENOUS | Status: DC

## 2020-01-15 MED ORDER — ENSURE PRE-SURGERY PO LIQD
296.0000 mL | Freq: Once | ORAL | Status: AC
  Administered 2020-01-15: 296 mL via ORAL
  Filled 2020-01-15: qty 296

## 2020-01-15 MED ORDER — ONDANSETRON HCL 4 MG/2ML IJ SOLN
INTRAMUSCULAR | Status: DC | PRN
Start: 2020-01-15 — End: 2020-01-15
  Administered 2020-01-15: 4 mg via INTRAVENOUS

## 2020-01-15 MED ORDER — VANCOMYCIN HCL 1000 MG IV SOLR
INTRAVENOUS | Status: DC | PRN
  Administered 2020-01-15: 1000 mg via TOPICAL

## 2020-01-15 MED ORDER — MENTHOL 3 MG MT LOZG: 1.0000 | LOZENGE | OROMUCOSAL | Status: DC | PRN

## 2020-01-15 MED ORDER — FENTANYL CITRATE (PF) 250 MCG/5ML IJ SOLN
INTRAMUSCULAR | Status: AC
  Filled 2020-01-15: qty 5

## 2020-01-15 MED ORDER — ENSURE ENLIVE PO LIQD
237.0000 mL | Freq: Two times a day (BID) | ORAL | Status: DC
  Administered 2020-01-15 – 2020-01-20 (×7): 237 mL via ORAL

## 2020-01-15 MED ORDER — ONDANSETRON HCL 4 MG/2ML IJ SOLN
INTRAMUSCULAR | Status: AC
  Filled 2020-01-15: qty 2

## 2020-01-15 MED ORDER — SUGAMMADEX SODIUM 200 MG/2ML IV SOLN
INTRAVENOUS | Status: DC | PRN
  Administered 2020-01-15: 200 mg via INTRAVENOUS

## 2020-01-15 MED ORDER — METHOCARBAMOL 1000 MG/10ML IJ SOLN
500.0000 mg | Freq: Four times a day (QID) | INTRAVENOUS | Status: DC | PRN
  Filled 2020-01-15: qty 5

## 2020-01-15 MED ORDER — ENOXAPARIN SODIUM 40 MG/0.4ML ~~LOC~~ SOLN: 40.0000 mg | Freq: Every day | SUBCUTANEOUS | 13 refills | Status: DC

## 2020-01-15 MED ORDER — EPHEDRINE 5 MG/ML INJ
INTRAVENOUS | Status: AC
  Filled 2020-01-15: qty 10

## 2020-01-15 MED ORDER — HYDROCODONE-ACETAMINOPHEN 7.5-325 MG PO TABS
1.0000 | ORAL_TABLET | ORAL | Status: DC | PRN
  Administered 2020-01-16: 1 via ORAL
  Filled 2020-01-15: qty 1

## 2020-01-15 MED ORDER — SUCCINYLCHOLINE CHLORIDE 20 MG/ML IJ SOLN
INTRAMUSCULAR | Status: DC | PRN
Start: 2020-01-15 — End: 2020-01-15
  Administered 2020-01-15: 80 mg via INTRAVENOUS

## 2020-01-15 MED ORDER — CEFAZOLIN SODIUM 1 G IJ SOLR
INTRAMUSCULAR | Status: AC
  Filled 2020-01-15: qty 20

## 2020-01-15 MED ORDER — 0.9 % SODIUM CHLORIDE (POUR BTL) OPTIME
TOPICAL | Status: DC | PRN
  Administered 2020-01-15: 1000 mL

## 2020-01-15 MED ORDER — MAGNESIUM CITRATE PO SOLN: 1.0000 | Freq: Once | ORAL | Status: DC | PRN

## 2020-01-15 MED ORDER — FENTANYL CITRATE (PF) 100 MCG/2ML IJ SOLN: 25.0000 ug | INTRAMUSCULAR | Status: DC | PRN

## 2020-01-15 MED ORDER — LACTATED RINGERS IV SOLN
INTRAVENOUS | Status: DC | PRN
Start: 2020-01-15 — End: 2020-01-15

## 2020-01-15 MED ORDER — HYDROCODONE-ACETAMINOPHEN 5-325 MG PO TABS
1.0000 | ORAL_TABLET | ORAL | Status: DC | PRN
  Administered 2020-01-16: 1 via ORAL
  Administered 2020-01-17: 2 via ORAL
  Filled 2020-01-15: qty 2
  Filled 2020-01-15 (×2): qty 1

## 2020-01-15 MED ORDER — ONDANSETRON HCL 4 MG/2ML IJ SOLN: 4.0000 mg | Freq: Four times a day (QID) | INTRAMUSCULAR | Status: DC | PRN

## 2020-01-15 MED ORDER — MORPHINE SULFATE (PF) 2 MG/ML IV SOLN
1.0000 mg | INTRAVENOUS | Status: DC | PRN
  Administered 2020-01-15 – 2020-01-16 (×3): 1 mg via INTRAVENOUS
  Filled 2020-01-15 (×3): qty 1

## 2020-01-15 MED ORDER — TRANEXAMIC ACID-NACL 1000-0.7 MG/100ML-% IV SOLN: 1000.0000 mg | INTRAVENOUS | Status: DC

## 2020-01-15 MED ORDER — DOCUSATE SODIUM 100 MG PO CAPS
100.0000 mg | ORAL_CAPSULE | Freq: Two times a day (BID) | ORAL | Status: DC
  Administered 2020-01-16 – 2020-01-20 (×9): 100 mg via ORAL
  Filled 2020-01-15 (×11): qty 1

## 2020-01-15 MED ORDER — VANCOMYCIN HCL 1000 MG IV SOLR
INTRAVENOUS | Status: AC
  Filled 2020-01-15: qty 1000

## 2020-01-15 MED ORDER — ACETAMINOPHEN 325 MG PO TABS
325.0000 mg | ORAL_TABLET | Freq: Four times a day (QID) | ORAL | Status: DC | PRN
  Filled 2020-01-15: qty 2

## 2020-01-15 MED ORDER — SORBITOL 70 % SOLN: 30.0000 mL | Freq: Every day | Status: DC | PRN

## 2020-01-15 MED ORDER — CEFAZOLIN SODIUM-DEXTROSE 2-4 GM/100ML-% IV SOLN
2.0000 g | Freq: Four times a day (QID) | INTRAVENOUS | Status: AC
  Administered 2020-01-15 – 2020-01-16 (×3): 2 g via INTRAVENOUS
  Filled 2020-01-15 (×3): qty 100

## 2020-01-15 MED ORDER — SODIUM CHLORIDE 0.9 % IV SOLN: INTRAVENOUS | Status: DC

## 2020-01-15 MED ORDER — ACETAMINOPHEN 500 MG PO TABS
500.0000 mg | ORAL_TABLET | Freq: Four times a day (QID) | ORAL | Status: AC
  Administered 2020-01-15 – 2020-01-16 (×3): 500 mg via ORAL
  Filled 2020-01-15 (×3): qty 1

## 2020-01-15 MED ORDER — CEFAZOLIN SODIUM-DEXTROSE 2-4 GM/100ML-% IV SOLN: 2.0000 g | INTRAVENOUS | Status: DC

## 2020-01-15 MED ORDER — FENTANYL CITRATE (PF) 250 MCG/5ML IJ SOLN
INTRAMUSCULAR | Status: DC | PRN
  Administered 2020-01-15 (×4): 50 ug via INTRAVENOUS

## 2020-01-15 MED ORDER — LIDOCAINE HCL (CARDIAC) PF 100 MG/5ML IV SOSY
PREFILLED_SYRINGE | INTRAVENOUS | Status: DC | PRN
  Administered 2020-01-15: 20 mg via INTRATRACHEAL

## 2020-01-15 SURGICAL SUPPLY — 39 items
BIPOLAR PROS AML 45 (Hips) ×2 IMPLANT
BIPOLAR PROS AML 45MM (Hips) ×1 IMPLANT
BLADE SAGITTAL (BLADE) ×3
BLADE SAW THK.89X75X18XSGTL (BLADE) ×1 IMPLANT
COVER PERINEAL POST (MISCELLANEOUS) ×3 IMPLANT
COVER SURGICAL LIGHT HANDLE (MISCELLANEOUS) ×3 IMPLANT
DRAPE IMP U-DRAPE 54X76 (DRAPES) ×3 IMPLANT
DRAPE POUCH INSTRU U-SHP 10X18 (DRAPES) ×3 IMPLANT
DRAPE STERI IOBAN 125X83 (DRAPES) ×3 IMPLANT
DRAPE U-SHAPE 47X51 STRL (DRAPES) ×5 IMPLANT
DRSG AQUACEL AG ADV 3.5X10 (GAUZE/BANDAGES/DRESSINGS) ×2 IMPLANT
DURAPREP 26ML APPLICATOR (WOUND CARE) ×6 IMPLANT
ELECT BLADE 4.0 EZ CLEAN MEGAD (MISCELLANEOUS) ×3
ELECT CAUTERY BLADE 6.4 (BLADE) ×3 IMPLANT
ELECT REM PT RETURN 9FT ADLT (ELECTROSURGICAL) ×3
ELECTRODE BLDE 4.0 EZ CLN MEGD (MISCELLANEOUS) IMPLANT
ELECTRODE REM PT RTRN 9FT ADLT (ELECTROSURGICAL) ×1 IMPLANT
GLOVE BIOGEL PI IND STRL 7.0 (GLOVE) ×1 IMPLANT
GLOVE BIOGEL PI INDICATOR 7.0 (GLOVE) ×2
GLOVE SKINSENSE NS SZ7.5 (GLOVE) ×4
GLOVE SKINSENSE STRL SZ7.5 (GLOVE) ×2 IMPLANT
GOWN STRL REIN XL XLG (GOWN DISPOSABLE) ×3 IMPLANT
HEAD BIPOLAR PROS AML 45 (Hips) IMPLANT
HEAD FEM STD 28X+1.5 STRL (Hips) ×2 IMPLANT
HOOD PEEL AWAY FLYTE STAYCOOL (MISCELLANEOUS) ×4 IMPLANT
KIT BASIN OR (CUSTOM PROCEDURE TRAY) ×3 IMPLANT
KIT TURNOVER KIT B (KITS) ×3 IMPLANT
MANIFOLD NEPTUNE II (INSTRUMENTS) ×3 IMPLANT
NS IRRIG 1000ML POUR BTL (IV SOLUTION) ×3 IMPLANT
PACK TOTAL JOINT (CUSTOM PROCEDURE TRAY) ×3 IMPLANT
PACK UNIVERSAL I (CUSTOM PROCEDURE TRAY) ×3 IMPLANT
PAD ARMBOARD 7.5X6 YLW CONV (MISCELLANEOUS) ×6 IMPLANT
STAPLER VISISTAT 35W (STAPLE) ×2 IMPLANT
STEM CORAIL KA11 (Stem) ×2 IMPLANT
SUT ETHIBOND 2 V 37 (SUTURE) ×3 IMPLANT
SUT PDS AB 1 CT  36 (SUTURE) ×3
SUT PDS AB 1 CT 36 (SUTURE) ×2 IMPLANT
TOWEL GREEN STERILE (TOWEL DISPOSABLE) ×3 IMPLANT
TOWEL GREEN STERILE FF (TOWEL DISPOSABLE) ×3 IMPLANT

## 2020-01-15 NOTE — Anesthesia Preprocedure Evaluation (Addendum)
Anesthesia Evaluation  Patient identified by MRN, date of birth, ID band Patient awake and Patient confused    Reviewed: Allergy & Precautions, NPO status , Patient's Chart, lab work & pertinent test results, Unable to perform ROS - Chart review only  History of Anesthesia Complications Negative for: history of anesthetic complications  Airway Mallampati: I  TM Distance: >3 FB Neck ROM: Full    Dental  (+) Edentulous Upper, Edentulous Lower   Pulmonary  01/14/2020 SARS coronavirus NEG   breath sounds clear to auscultation       Cardiovascular hypertension, Pt. on medications and Pt. on home beta blockers (-) angina Rhythm:Regular Rate:Normal     Neuro/Psych Dementia TIA   GI/Hepatic negative GI ROS, Neg liver ROS,   Endo/Other  negative endocrine ROS  Renal/GU negative Renal ROS     Musculoskeletal   Abdominal   Peds  Hematology negative hematology ROS (+)   Anesthesia Other Findings   Reproductive/Obstetrics                            Anesthesia Physical Anesthesia Plan  ASA: III  Anesthesia Plan: General   Post-op Pain Management:    Induction: Intravenous  PONV Risk Score and Plan: 3 and Ondansetron, Dexamethasone and Treatment may vary due to age or medical condition  Airway Management Planned: Oral ETT  Additional Equipment: None  Intra-op Plan:   Post-operative Plan: Extubation in OR  Informed Consent: I have reviewed the patients History and Physical, chart, labs and discussed the procedure including the risks, benefits and alternatives for the proposed anesthesia with the patient or authorized representative who has indicated his/her understanding and acceptance.     Dental advisory given and Consent reviewed with POA  Plan Discussed with: Surgeon and CRNA  Anesthesia Plan Comments: (Pt has no family, discussed with POA)       Anesthesia Quick Evaluation

## 2020-01-15 NOTE — Progress Notes (Signed)
Initial Nutrition Assessment  DOCUMENTATION CODES:   Not applicable  INTERVENTION:  Ensure Enlive po BID, each supplement provides 350 kcal and 20 grams of protein  Recommend obtaining current ht/wt to fully assess needs  NUTRITION DIAGNOSIS:   Increased nutrient needs related to post-op healing as evidenced by estimated needs.  GOAL:   Patient will meet greater than or equal to 90% of their needs  MONITOR:   Labs, Supplement acceptance, Weight trends, PO intake, Skin  REASON FOR ASSESSMENT:   Malnutrition Screening Tool    ASSESSMENT:  RD working remotely.   85 year old female with past medical history significant of dementia, HLD, Neuralgia facialis vera, and history of TIA presented from nursing facility after witnessed mechanical fall. Patient transferred to [facility] for surgical management of right femoral neck fracture.  Patient is s/p right hemiarthroplasty this morning.   Patient is oriented to self only at baseline, unable to obtain nutrition history. Diet advanced to regular at 1156, no documented intakes at this time. Will provide Ensure supplement to aid with meeting needs.   Per chart, no height or weight on file. Per Care Everywhere, pt was 5'1 and weighed 155 lb in 2015. Recommend obtaining current anthropometrics as able to fully assess needs. Will use 2015 height with IBW for estimating needs at this time. Needs will be reassessed with current weight when available.  Medications reviewed and include: D3, Depakote, Colace, Remeron, MVI, Zinc sulfate No new labs for review  NUTRITION - FOCUSED PHYSICAL EXAM: Unable to complete at this time, RD working remotely.  Diet Order:   Diet Order            Diet regular Room service appropriate? Yes; Fluid consistency: Thin  Diet effective now                 EDUCATION NEEDS:   No education needs have been identified at this time  Skin:  Skin Assessment: Skin Integrity Issues: Skin Integrity Issues::  Incisions Incisions: closed; R hip  Last BM:  6/18  Height:   Ht Readings from Last 1 Encounters:  No data found for Ht    Weight:   Wt Readings from Last 1 Encounters:  No data found for Wt    BMI:  There is no height or weight on file to calculate BMI.  Estimated Nutritional Needs:   Kcal:  1500-1700  Protein:  75-85  Fluid:  >/= 1.4 L/day   Lajuan Lines, RD, LDN Clinical Nutrition After Hours/Weekend Pager # in [place]

## 2020-01-15 NOTE — Anesthesia Postprocedure Evaluation (Signed)
Anesthesia Post Note  Patient: Journalist, newspaper  Procedure(s) Performed: ANTERIOR APPROACH HEMI HIP ARTHROPLASTY (Right )     Patient location during evaluation: PACU Anesthesia Type: General Level of consciousness: awake and alert, patient uncooperative and confused (no change from pre-op exam) Pain management: pain level controlled Vital Signs Assessment: post-procedure vital signs reviewed and stable Respiratory status: spontaneous breathing, nonlabored ventilation and respiratory function stable Cardiovascular status: blood pressure returned to baseline and stable Postop Assessment: no apparent nausea or vomiting Anesthetic complications: no   No complications documented.  Last Vitals:  Vitals:   01/15/20 1010 01/15/20 1025  BP: (!) 129/104 136/86  Pulse: 84 64  Resp: 16 13  Temp: (!) 36.4 C 36.7 C  SpO2: 100% 100%    Last Pain:  Vitals:   01/15/20 1010  TempSrc:   PainSc: 0-No pain                 Ranay Ketter,E. Dario Yono

## 2020-01-15 NOTE — Op Note (Signed)
ANTERIOR APPROACH HEMI HIP ARTHROPLASTY  Procedure Note Janet [name]   [id number]  Pre-op Diagnosis: RIGHT FEMORAL NECK FRACTURE     Post-op Diagnosis: same   Operative Procedures  1. Prosthetic replacement for femoral neck fracture. CPT [phone]  Personnel  Surgeon(s): Leandrew Koyanagi, MD  ASSIST: none   Anesthesia: general  Prosthesis: Depuy Femur: Corail 11 KA Head: 45 mm size: +1.5 Bearing Type: bipolar  Hip Hemiarthroplasty (Anterior Approach) Op Note:  After informed consent was obtained and the operative extremity marked in the holding area, the patient was brought back to the operating room and placed supine on the HANA table. Next, the operative extremity was prepped and draped in normal sterile fashion. Surgical timeout occurred verifying patient identification, surgical site, surgical procedure and administration of antibiotics.  A modified anterior Smith-Peterson approach to the hip was performed, using the interval between tensor fascia lata and sartorius.  Dissection was carried bluntly down onto the anterior hip capsule. The lateral femoral circumflex vessels were identified and coagulated. A capsulotomy was performed and the capsular flaps tagged for later repair.  The neck osteotomy was performed. The femoral head was removed and found a 45 mm head was the appropriate fit.    We then turned our attention to the femur.  After placing the femoral hook, the leg was taken to externally rotated, extended and adducted position taking care to perform soft tissue releases to allow for adequate mobilization of the femur. Soft tissue was cleared from the shoulder of the greater trochanter and the hook elevator used to improve exposure of the proximal femur. Sequential broaching performed up to a size 11. Trial neck and head were placed. The leg was brought back up to neutral and the construct reduced. The position and sizing of components, offset and leg lengths were checked using  fluoroscopy. Stability of the construct was checked in extension and external rotation without any subluxation or impingement of prosthesis. We dislocated the prosthesis, dropped the leg back into position, removed trial components, and irrigated copiously. The final stem and head was then placed, the leg brought back up, the system reduced and fluoroscopy used to verify positioning.  We irrigated, obtained hemostasis and closed the capsule using #2 ethibond suture.  The fascia was closed with #1 vicryl plus, the deep fat layer was closed with 0 vicryl, the subcutaneous layers closed with 2.0 Vicryl Plus and the skin closed with staples. A sterile dressing was applied. The patient was awakened in the operating room and taken to recovery in stable condition. All sponge, needle, and instrument counts were correct at the end of the case.   Position: supine  Complications: see description of procedure.  Time Out: performed   Drains/Packing: none  Estimated blood loss: see anesthesia record  Returned to Recovery Room: in good condition.   Antibiotics: yes   Mechanical VTE (DVT) Prophylaxis: sequential compression devices, TED thigh-high  Chemical VTE (DVT) Prophylaxis: lovenox  Fluid Replacement: Crystalloid: see anesthesia record  Specimens Removed: 1 to pathology   Sponge and Instrument Count Correct? yes   PACU: portable radiograph - low AP   Admission: inpatient status, start PT & OT POD#1  Plan/RTC: Return in 2 weeks for staple removal. Return in 6 weeks to see MD.  Weight Bearing/Load Lower Extremity: full  Hip precautions: none  [name], MD [facility] 9:50 AM   Implant Name Type Inv. Item Serial No. Manufacturer Lot No. LRB No. Used Action  STEM CORAIL [id number] -  [id number] Stem STEM CORAIL [id number]  [employer]PU[employer] [id number] Right 1 Implanted  BIPOLAR PROS AML 45MM - [id number] Hips BIPOLAR PROS AML 45MM  DEPUY SYNTHES [id number] Right 1 Implanted  HIP BALL ARTICU DEPUY  - [id number] Hips HIP BALL ARTICU DEPUY  DEPUY SYNTHES [id number] Right 1 Implanted   \

## 2020-01-15 NOTE — Discharge Instructions (Signed)
° ° °  1. Change dressings as needed °2. May shower but keep incisions covered and dry °3. Take lovenox to prevent blood clots °4. Take stool softeners as needed °5. Take pain meds as needed ° °

## 2020-01-15 NOTE — Progress Notes (Signed)
PROGRESS NOTE  Janet [name] QQP:[id number] DOB: Oct 07, 1934   PCP: Prince Solian, MD  Patient is from: Nursing home  DOA: 01/14/2020 LOS: 1  Brief Narrative / Interim history: 85 year old female with severe dementia, TIA and hyperlipidemia brought to [facility] from nursing home for right hip pain after witnessed mechanical fall.  She was transferred to [facility] for surgical intervention.  Patient underwent prosthetic replacement for femoral neck fracture by Dr. Erlinda Hong on 01/15/2020.  Subjective: Seen and examined earlier this afternoon after she returned from surgery.  Sleeping soundly.  Does not appear to be in distress.  Hemodynamically stable.  She woke up to voice and screamed "do not heart me please". She quickly calmed down and went back to sleep when I moved away.  No family member at bedside.  Objective: Vitals:   01/15/20 0142 01/15/20 0434 01/15/20 1010 01/15/20 1025  BP: (!) 158/76 139/60 (!) 129/104 136/86  Pulse: 71 70 84 64  Resp: 17 16 16 13   Temp: 98.4 F (36.9 C) 99 F (37.2 C) (!) 97.5 F (36.4 C) 98 F (36.7 C)  TempSrc: Oral     SpO2: 94% 92% 100% 100%    Intake/Output Summary (Last 24 hours) at 01/15/2020 1345 Last data filed at 01/15/2020 0956 Gross per 24 hour  Intake 1050 ml  Output 300 ml  Net 750 ml   There were no vitals filed for this visit.  Examination:  GENERAL: No apparent distress.  Nontoxic. HEENT: MMM.  Vision and hearing grossly intact.  NECK: Supple.  No apparent JVD.  RESP:  No IWOB.  Fair aeration bilaterally. CVS: Stable on bedside monitor. MSK/EXT:  Moves extremities. No apparent deformity. SKIN: no apparent skin lesion or wound NEURO: Sleepy but wakes to voice easily.  No apparent focal neuro deficit. PSYCH: Sleepy. Anxious and scared   Procedures:  01/15/2020-prosthetic replacement for femoral neck fracture  Microbiology summarized: COVID-19 PCR negative.  Assessment & Plan: "Witnessed mechanical" fall at nursing  home-multiple potential causes including severe dementia and meds Comminuted impacted displaced closed right femoral neck fracture -s/p prosthetic replacement for femoral neck fracture by Dr. Erlinda Hong on 01/15/1920 -Pain control, activity and VTE prophylaxis per Ortho recommendations  Severe dementia without behavioral disturbance -Continue home medications -Reorientation, delirium and fall precautions  Chronic thrombocytopenia: At baseline -Monitor as appropriate  Thyroid nodule: 17 mm hypodense nodule in the left lobe of thyroid gland noted.  Unclear significance of this. -Check TSH -Follow thyroid ultrasound-ordered on admission.  Hyperlipidemia-on Lipitor 40 mg daily.  Unclear indication.  Not diabetic.  No CVA vaccination of TIA discharge. -Recommend discontinuing  Mood disorder: On Depakote, Lexapro, Remeron at home -Continue home meds  Urinary incontinence -Continue home Vesicare  At risk for polypharmacy: On significant amount of sedating medication which would increase her risk of fall -Recommend simplified regimen with essential meds on discharge.  Goal of care discussion: Discussed about goal of care including CODE STATUS with Manuela Schwartz over the phone.  Patient with severe dementia.  Now with fall and right hip fracture.  She is still full code. I believe heroic interventions such as CPR would pose more harm than benefit.  I expressed my concern to Manuela Schwartz who is in agreement but would like discuss this with patient's brother-in-law who handles patient's financial matter. Until then, patient will remain full code.  Nutrition There is no height or weight on file to calculate BMI. Nutrition Problem: Increased nutrient needs Etiology: post-op healing Signs/Symptoms: estimated needs Interventions: Ensure Enlive (each supplement  provides 350kcal and 20 grams of protein), Refer to RD note for recommendations   DVT prophylaxis:  enoxaparin (LOVENOX) injection 40 mg Start: 01/16/20  0800 SCDs Start: 01/15/20 1157 Sequential compression device to OR Start: 01/15/20 1156 SCDs Start: 01/14/20 2304  Code Status: Full code Family Communication: Updated patient's daughter Manuela Schwartz over the phone Status is: Inpatient  Remains inpatient appropriate because:Altered mental status, IV treatments appropriate due to intensity of illness or inability to take PO and Inpatient level of care appropriate due to severity of illness   Dispo: The patient is from: SNF              Anticipated d/c is to: SNF              Anticipated d/c date is: 2 days              Patient currently is not medically stable to d/c.       Consultants:  Orthopedic surgery   Sch Meds:  Scheduled Meds: . acetaminophen  500 mg Oral Q6H  . atorvastatin  40 mg Oral QPM  . cholecalciferol  1,000 Units Oral Daily  . darifenacin  7.5 mg Oral Daily  . divalproex  250 mg Oral BID  . docusate sodium  100 mg Oral BID  . donepezil  10 mg Oral BID  . [START ON 01/16/2020] enoxaparin (LOVENOX) injection  40 mg Subcutaneous Q24H  . escitalopram  10 mg Oral Daily  . feeding supplement (ENSURE ENLIVE)  237 mL Oral BID BM  . feeding supplement  296 mL Oral Once  . metoprolol succinate  25 mg Oral Daily  . mirtazapine  7.5 mg Oral QHS  . multivitamin with minerals  1 tablet Oral Daily  . tranexamic acid (CYKLOKAPRON) topical - INTRAOP  2,000 mg Topical Once  . zinc sulfate  220 mg Oral Daily   Continuous Infusions: . sodium chloride    . ceFAZolin    .  ceFAZolin (ANCEF) IV    . methocarbamol (ROBAXIN) IV     PRN Meds:.[START ON 01/16/2020] acetaminophen, alum & mag hydroxide-simeth, HYDROcodone-acetaminophen, HYDROcodone-acetaminophen, magnesium citrate, magnesium hydroxide, menthol-cetylpyridinium **OR** phenol, methocarbamol **OR** methocarbamol (ROBAXIN) IV, morphine injection, ondansetron **OR** ondansetron (ZOFRAN) IV, polyethylene glycol, sorbitol  Antimicrobials: Anti-infectives (From admission,  onward)   Start     Dose/Rate Route Frequency Ordered Stop   01/15/20 1500  ceFAZolin (ANCEF) IVPB 2g/100 mL premix     Discontinue     2 g 200 mL/hr over 30 Minutes Intravenous Every 6 hours 01/15/20 1156 01/16/20 0859   01/15/20 1200  ceFAZolin (ANCEF) IVPB 2g/100 mL premix  Status:  Discontinued        2 g 200 mL/hr over 30 Minutes Intravenous On call to O.R. 01/15/20 1155 01/15/20 1222   01/15/20 1008  vancomycin (VANCOCIN) powder  Status:  Discontinued          As needed 01/15/20 1008 01/15/20 1008   01/15/20 0802  ceFAZolin (ANCEF) 2-4 GM/100ML-% IVPB       Note to Pharmacy: Henrine Screws   : cabinet override      01/15/20 0802 01/15/20 2014   01/15/20 0800  ceFAZolin (ANCEF) IVPB 2g/100 mL premix  Status:  Discontinued        2 g 200 mL/hr over 30 Minutes Intravenous  Once 01/15/20 0759 01/15/20 1045       I have personally reviewed the following labs and images: CBC: Recent Labs  Lab 01/14/20 2149 01/15/20 1201  WBC 10.4 10.1  NEUTROABS 7.7  --   HGB 11.5* 10.3*  HCT 35.1* 31.6*  MCV 90.7 91.1  PLT 123* 112*   BMP &GFR Recent Labs  Lab 01/14/20 2149  NA 142  K 3.5  CL 104  CO2 27  GLUCOSE 144*  BUN 28*  CREATININE 0.90  CALCIUM 8.8*   CrCl cannot be calculated (Unknown ideal weight.). Liver & Pancreas: No results for input(s): AST, ALT, ALKPHOS, BILITOT, PROT, ALBUMIN in the last 168 hours. No results for input(s): LIPASE, AMYLASE in the last 168 hours. No results for input(s): AMMONIA in the last 168 hours. Diabetic: No results for input(s): HGBA1C in the last 72 hours. No results for input(s): GLUCAP in the last 168 hours. Cardiac Enzymes: No results for input(s): CKTOTAL, CKMB, CKMBINDEX, TROPONINI in the last 168 hours. No results for input(s): PROBNP in the last 8760 hours. Coagulation Profile: Recent Labs  Lab 01/14/20 2149  INR 1.0   Thyroid Function Tests: No results for input(s): TSH, T4TOTAL, FREET4, T3FREE, THYROIDAB in the last 72  hours. Lipid Profile: No results for input(s): CHOL, HDL, LDLCALC, TRIG, CHOLHDL, LDLDIRECT in the last 72 hours. Anemia Panel: No results for input(s): VITAMINB12, FOLATE, FERRITIN, TIBC, IRON, RETICCTPCT in the last 72 hours. Urine analysis:    Component Value Date/Time   COLORURINE YELLOW 02/25/2011 1021   APPEARANCEUR CLEAR 02/25/2011 1021   LABSPEC 1.008 02/25/2011 1021   PHURINE 7.5 02/25/2011 1021   GLUCOSEU NEGATIVE 02/25/2011 1021   HGBUR NEGATIVE 02/25/2011 1021   BILIRUBINUR NEGATIVE 02/25/2011 1021   KETONESUR NEGATIVE 02/25/2011 1021   PROTEINUR NEGATIVE 02/25/2011 1021   UROBILINOGEN 0.2 02/25/2011 1021   NITRITE NEGATIVE 02/25/2011 1021   LEUKOCYTESUR NEGATIVE 02/25/2011 1021   Sepsis Labs: Invalid input(s): PROCALCITONIN, [place]  Microbiology: Recent Results (from the past 240 hour(s))  SARS Coronavirus 2 by RT PCR (hospital order, performed in [facility] hospital lab) Nasopharyngeal Nasopharyngeal Swab     Status: None   Collection Time: 01/14/20 10:33 PM   Specimen: Nasopharyngeal Swab  Result Value Ref Range Status   SARS Coronavirus 2 NEGATIVE NEGATIVE Final    Comment: (NOTE) SARS-CoV-2 target nucleic acids are NOT DETECTED.  The SARS-CoV-2 RNA is generally detectable in upper and lower respiratory specimens during the acute phase of infection. The lowest concentration of SARS-CoV-2 viral copies this assay can detect is 250 copies / mL. A negative result does not preclude SARS-CoV-2 infection and should not be used as the sole basis for treatment or other patient management decisions.  A negative result may occur with improper specimen collection / handling, submission of specimen other than nasopharyngeal swab, presence of viral mutation(s) within the areas targeted by this assay, and inadequate number of viral copies (<250 copies / mL). A negative result must be combined with clinical observations, patient history, and epidemiological  information.  Fact Sheet for Patients:   StrictlyIdeas.no  Fact Sheet for Healthcare Providers: BankingDealers.co.za  This test is not yet approved or  cleared by the [place] FDA and has been authorized for detection and/or diagnosis of SARS-CoV-2 by FDA under an Emergency Use Authorization (EUA).  This EUA will remain in effect (meaning this test can be used) for the duration of the COVID-19 declaration under Section 564(b)(1) of the Act, 21 U.S.C. section 360bbb-3(b)(1), unless the authorization is terminated or revoked sooner.  Performed at [facility], [place] [address] [place], [place] [address]     Radiology Studies: DG Chest 1  View  Result Date: 01/14/2020 CLINICAL DATA:  Status post fall. EXAM: CHEST  1 VIEW COMPARISON:  None. FINDINGS: There is no evidence of acute infiltrate, pleural effusion or pneumothorax. The heart size and mediastinal contours are within normal limits. The visualized skeletal structures are unremarkable. IMPRESSION: No active disease. Electronically Signed   By: Virgina Norfolk M.D.   On: 01/14/2020 21:28   CT HEAD WO CONTRAST  Result Date: 01/14/2020 CLINICAL DATA:  Witnessed fall today.  Dementia patient. EXAM: CT HEAD WITHOUT CONTRAST TECHNIQUE: Contiguous axial images were obtained from the base of the skull through the vertex without intravenous contrast. COMPARISON:  Most recent head CT 07/12/2011 FINDINGS: Brain: No intracranial hemorrhage, mass effect, or midline shift. Moderate generalized atrophy and chronic small vessel ischemia. Remote lacunar infarct in the left basal ganglia. No hydrocephalus. The basilar cisterns are patent. No evidence of territorial infarct or acute ischemia. No extra-axial or intracranial fluid collection. Vascular: Atherosclerosis of skullbase vasculature without hyperdense vessel or abnormal calcification. Skull: No fracture or focal lesion.  Sinuses/Orbits: Mucosal thickening of the ethmoid air cells and right greater than left maxillary sinus. Left mastoid air cells are hypo pneumatized. No acute findings. Left lens extraction. Other: None. IMPRESSION: 1. No acute intracranial abnormality. No skull fracture. 2. Generalized atrophy and chronic small vessel ischemia. Remote lacunar infarct in the left basal ganglia. Electronically Signed   By: Keith Rake M.D.   On: 01/14/2020 21:44   CT CERVICAL SPINE WO CONTRAST  Result Date: 01/14/2020 CLINICAL DATA:  Witnessed fall today.  Dementia patient. EXAM: CT CERVICAL SPINE WITHOUT CONTRAST TECHNIQUE: Multidetector CT imaging of the cervical spine was performed without intravenous contrast. Multiplanar CT image reconstructions were also generated. COMPARISON:  None. FINDINGS: Alignment: Trace anterolisthesis of C7 on T1 is likely facet mediated on degenerative. No traumatic subluxation. No jumped or perched facets. C1 is well aligned with C2. Skull base and vertebrae: No acute fracture. Vertebral body heights are maintained. The dens and skull base are intact. Soft tissues and spinal canal: No prevertebral fluid or swelling. No visible canal hematoma. Disc levels: Mild diffuse degenerative disc disease with disc space narrowing and endplate spurring. There is multilevel facet hypertrophy. Upper chest: 17 mm hypodense nodule in the left lobe of the thyroid gland. No acute findings. Other: Carotid calcifications. IMPRESSION: 1. Mild degenerative change in the cervical spine without acute fracture or subluxation. 2. A 17 mm hypodense nodule in the left lobe of the thyroid gland. In the setting of significant comorbidities or limited life expectancy, no follow-up recommended (ref: J Am Coll Radiol. 2015 Feb;12(2): 143-50). Electronically Signed   By: Keith Rake M.D.   On: 01/14/2020 21:47   Pelvis Portable  Result Date: 01/15/2020 CLINICAL DATA:  Right hip fracture post arthroplasty. EXAM:  PORTABLE PELVIS 1-2 VIEWS COMPARISON:  01/14/2020 FINDINGS: Examination demonstrates expected changes post right hip arthroplasty which is intact and normally located. Mild degenerate change of the left hip. No acute fracture or dislocation. Mild degenerative change of the spine. IMPRESSION: Expected changes post right hip arthroplasty. Electronically Signed   By: Marin Olp M.D.   On: 01/15/2020 11:39   CT HIP RIGHT WO CONTRAST  Result Date: 01/14/2020 CLINICAL DATA:  Right hip fracture EXAM: CT OF THE RIGHT HIP WITHOUT CONTRAST TECHNIQUE: Multidetector CT imaging of the right hip was performed according to the standard protocol. Multiplanar CT image reconstructions were also generated. COMPARISON:  Radiograph same day FINDINGS: Bones/Joint/Cartilage There is a comminuted impacted fracture of  the transcervical right femoral neck. There is superior and anterior subluxation with angulation of the femoral shaft. The femoral head is still well seated within the acetabulum. There is small fracture fragment seen adjacent to the femoral neck. A sclerotic foci seen within the inferior femoral head. There is diffuse osteopenia. No other definite fractures are identified. Ligaments Suboptimally assessed by CT. Muscles and Tendons The muscles surrounding the hip normal appearance without evidence of focal atrophy or tear. The visualized portions of the tendons appear to be grossly intact. Soft tissues Soft tissue swelling seen over the lateral aspect of the hip. Colonic diverticula are noted without diverticulitis. IMPRESSION: Comminuted impacted displaced anteriorly angulated transcervical right femoral neck fracture. Electronically Signed   By: Prudencio Pair M.D.   On: 01/14/2020 23:02   DG C-Arm 1-60 Min  Result Date: 01/15/2020 CLINICAL DATA:  Right hip fracture post right hip arthroplasty. EXAM: OPERATIVE RIGHT HIP (WITH PELVIS IF PERFORMED) 3 VIEWS TECHNIQUE: Fluoroscopic spot image(s) were submitted for  interpretation post-operatively. COMPARISON:  01/14/2020 FINDINGS: Examination demonstrates evidence of patient's subcapital right femoral neck fracture with subsequent images demonstrate placement of a right hip arthroplasty intact and normally located. IMPRESSION: Expected changes post right hip arthroplasty. Electronically Signed   By: Marin Olp M.D.   On: 01/15/2020 11:38   DG HIP OPERATIVE UNILAT W OR W/O PELVIS RIGHT  Result Date: 01/15/2020 CLINICAL DATA:  Right hip fracture post right hip arthroplasty. EXAM: OPERATIVE RIGHT HIP (WITH PELVIS IF PERFORMED) 3 VIEWS TECHNIQUE: Fluoroscopic spot image(s) were submitted for interpretation post-operatively. COMPARISON:  01/14/2020 FINDINGS: Examination demonstrates evidence of patient's subcapital right femoral neck fracture with subsequent images demonstrate placement of a right hip arthroplasty intact and normally located. IMPRESSION: Expected changes post right hip arthroplasty. Electronically Signed   By: Marin Olp M.D.   On: 01/15/2020 11:38   DG Hip Unilat  With Pelvis 2-3 Views Right  Result Date: 01/14/2020 CLINICAL DATA:  Status post fall. EXAM: DG HIP (WITH OR WITHOUT PELVIS) 2-3V RIGHT COMPARISON:  None. FINDINGS: Acute fracture deformity is seen extending through the neck of the proximal right femur. Approximately 1/2 shaft width superior displacement of the distal fracture site is seen. There is no evidence of dislocation. There is no evidence of significant arthropathy or other focal bone abnormality. IMPRESSION: Acute fracture of the proximal right femur. Electronically Signed   By: Virgina Norfolk M.D.   On: 01/14/2020 21:26   DG FEMUR, MIN 2 VIEWS RIGHT  Result Date: 01/14/2020 CLINICAL DATA:  Status post fall. EXAM: RIGHT FEMUR 2 VIEWS COMPARISON:  None. FINDINGS: Acute fracture deformity is seen extending through the neck of the proximal right femur. Approximately 1/2 shaft width superior displacement of the distal fracture  site is noted. There is no evidence of associated dislocation. Soft tissue structures are unremarkable. IMPRESSION: Acute fracture of the proximal right femur. Electronically Signed   By: Virgina Norfolk M.D.   On: 01/14/2020 21:27     Kenedee Molesky T. [place]  If 7PM-7AM, please contact night-coverage [email or web address] Password [email or web address] 01/15/2020, 1:45 PM

## 2020-01-15 NOTE — Progress Notes (Signed)
PT Cancellation Note  Patient Details Name: Janet [name] MRN: [id number] DOB: 1935/07/01   Cancelled Treatment:    Reason Eval/Treat Not Completed: Patient declined, no reason specified. Pt's healthcare POA present upon arrival reporting pt recently received morphine and for the first time all day is resting and calm since surgery. RN and HCPOA report that pt is scared of all males at this time, HCPOA is unsure if an event has happened at her nursing facility causing this. HCPOA and PT make the decision to allow the pt to rest and for PT to return tomorrow with a female therapist.   Zenaida Niece 01/15/2020, 3:43 PM

## 2020-01-15 NOTE — Anesthesia Procedure Notes (Signed)
Procedure Name: Intubation Date/Time: 01/15/2020 8:35 AM Performed by: Clovis Cao, CRNA Pre-anesthesia Checklist: Patient identified, Emergency Drugs available, Suction available, Patient being monitored and Timeout performed Patient Re-evaluated:Patient Re-evaluated prior to induction Oxygen Delivery Method: Circle system utilized Preoxygenation: Pre-oxygenation with 100% oxygen Induction Type: IV induction Ventilation: Mask ventilation without difficulty Laryngoscope Size: Miller and 2 Grade View: Grade I Tube type: Oral Tube size: 7.0 mm Number of attempts: 1 Airway Equipment and Method: Stylet Placement Confirmation: ETT inserted through vocal cords under direct vision,  positive ETCO2 and breath sounds checked- equal and bilateral Secured at: 21 cm Tube secured with: Tape Dental Injury: Teeth and Oropharynx as per pre-operative assessment

## 2020-01-15 NOTE — Progress Notes (Signed)
Daughter, Mrs. Rodena Piety talked to patient's HPOA. They have agreed on DNR/DNI which would be in Mrs. Yan's best interest given her severe dementia and current situation. Code status changed to DNR/DNI.

## 2020-01-15 NOTE — Progress Notes (Signed)
Spoke to Kaylyn Layer for consent. She reports that patient has not blood family and that she is the POA. Do not have documentation of POA, however when speaking to Manuela Schwartz she is familiar with patient. Previous signed release of information is on file. Due to mental status unable to give Peridex.

## 2020-01-15 NOTE — Progress Notes (Signed)
Pt arrived to floor from [employer].  Alert to self only. Pt able to answer most assessment and admission questions.   Pt made aware of plan for surgery tomorrow, CHG bath completed upon arrival.

## 2020-01-15 NOTE — Transfer of Care (Signed)
Immediate Anesthesia Transfer of Care Note  Patient: Janet [name]  Procedure(s) Performed: ANTERIOR APPROACH HEMI HIP ARTHROPLASTY (Right )  Patient Location: PACU  Anesthesia Type:General  Level of Consciousness: drowsy  Airway & Oxygen Therapy: Patient Spontanous Breathing and Patient connected to face mask oxygen  Post-op Assessment: Report given to RN and Post -op Vital signs reviewed and stable  Post vital signs: Reviewed and stable  Last Vitals:  Vitals Value Taken Time  BP 129/104 01/15/20 1010  Temp 36.4 C 01/15/20 1010  Pulse 84 01/15/20 1010  Resp 16 01/15/20 1010  SpO2 100 % 01/15/20 1010    Last Pain:  Vitals:   01/15/20 1010  TempSrc:   PainSc: 0-No pain         Complications: No complications documented.

## 2020-01-15 NOTE — ED Notes (Signed)
Attempted to call the floor for report, no answer.

## 2020-01-15 NOTE — Consult Note (Signed)
ORTHOPAEDIC CONSULTATION  REQUESTING PHYSICIAN: Mercy Riding, MD  Chief Complaint: Right femoral neck fracture  HPI: Janet [name] is a 85 y.o. female who presents with right hip fracture s/p mechanical fall.  Has dementia.  I spoke with daughter this morning.   Past Medical History:  Diagnosis Date  . Cancer ([place])   . High cholesterol   . Neuralgia facialis vera   . TIA (transient ischemic attack)    History reviewed. No pertinent surgical history. Social History   Socioeconomic History  . Marital status: Married    Spouse name: Not on file  . Number of children: Not on file  . Years of education: Not on file  . Highest education level: Not on file  Occupational History  . Not on file  Tobacco Use  . Smoking status: Never Smoker  . Smokeless tobacco: Never Used  Substance and Sexual Activity  . Alcohol use: No  . Drug use: No  . Sexual activity: Not on file  Other Topics Concern  . Not on file  Social History Narrative  . Not on file   Social Determinants of Health   Financial Resource Strain:   . Difficulty of Paying Living Expenses:   Food Insecurity:   . Worried About Charity fundraiser in the Last Year:   . Arboriculturist in the Last Year:   Transportation Needs:   . Film/video editor (Medical):   Marland Kitchen Lack of Transportation (Non-Medical):   Physical Activity:   . Days of Exercise per Week:   . Minutes of Exercise per Session:   Stress:   . Feeling of Stress :   Social Connections:   . Frequency of Communication with Friends and Family:   . Frequency of Social Gatherings with Friends and Family:   . Attends Religious Services:   . Active Member of Clubs or Organizations:   . Attends Archivist Meetings:   Marland Kitchen Marital Status:    History reviewed. No pertinent family history. Allergies  Allergen Reactions  . Aspirin Nausea And Vomiting  . Penicillins Hives  . Sulfa Antibiotics Nausea And Vomiting   Prior to Admission medications    Medication Sig Start Date End Date Taking? Authorizing Provider  acetaminophen (TYLENOL) 325 MG tablet Take 650 mg by mouth in the morning and at bedtime.   Yes [provider]  acetaminophen (TYLENOL) 500 MG tablet Take 500 mg by mouth every 6 (six) hours as needed for moderate pain.   Yes [provider]  alum & mag hydroxide-simeth (MYLANTA) 200-200-20 MG/5ML suspension Take 30 mLs by mouth every 6 (six) hours as needed for indigestion or heartburn.   Yes [provider]  Ascorbic Acid (VITAMIN C PO) Take 500 mg by mouth in the morning and at bedtime.   Yes [provider]  atorvastatin (LIPITOR) 40 MG tablet Take 40 mg by mouth every evening.   Yes [provider]  cholecalciferol (VITAMIN D3) 25 MCG (1000 UNIT) tablet Take 1,000 Units by mouth daily.   Yes [provider]  divalproex (DEPAKOTE) 250 MG DR tablet Take 250 mg by mouth 2 (two) times daily.   Yes [provider]  donepezil (ARICEPT) 10 MG tablet Take 10 mg by mouth in the morning and at bedtime.   Yes [provider]  escitalopram (LEXAPRO) 10 MG tablet Take 10 mg by mouth daily.   Yes [provider]  guaiFENesin (ROBITUSSIN) 100 MG/5ML liquid Take 200 mg  by mouth 4 (four) times daily as needed for cough or congestion.   Yes [provider]  loperamide (IMODIUM) 2 MG capsule Take 2 mg by mouth as needed for diarrhea or loose stools.   Yes [provider]  magnesium hydroxide (MILK OF MAGNESIA) 400 MG/5ML suspension Take 30 mLs by mouth at bedtime as needed for mild constipation.   Yes [provider]  metoprolol succinate (TOPROL-XL) 25 MG 24 hr tablet Take 25 mg by mouth daily.   Yes [provider]  mirtazapine (REMERON) 7.5 MG tablet Take 7.5 mg by mouth at bedtime.   Yes [provider]  Multiple Vitamins-Minerals (WOMENS 50+ MULTI VITAMIN/MIN) TABS Take 1 tablet by mouth daily.   Yes [provider]  solifenacin (VESICARE) 5 MG tablet Take 5 mg by mouth daily.   Yes [provider]  zinc gluconate 50 MG tablet Take 50 mg by mouth in the morning and at bedtime.   Yes [provider]   DG Chest 1 View  Result Date: 01/14/2020 CLINICAL DATA:  Status post fall. EXAM: CHEST  1 VIEW COMPARISON:  None. FINDINGS: There is no evidence of acute infiltrate, pleural effusion or pneumothorax. The heart size and mediastinal contours are within normal limits. The visualized skeletal structures are unremarkable. IMPRESSION: No active disease. Electronically Signed   By: Virgina Norfolk M.D.   On: 01/14/2020 21:28   CT HEAD WO CONTRAST  Result Date: 01/14/2020 CLINICAL DATA:  Witnessed fall today.  Dementia patient. EXAM: CT HEAD WITHOUT CONTRAST TECHNIQUE: Contiguous axial images were obtained from the base of the skull through the vertex without intravenous contrast. COMPARISON:  Most recent head CT 07/12/2011 FINDINGS: Brain: No intracranial hemorrhage, mass effect, or midline shift. Moderate generalized atrophy and chronic small vessel ischemia. Remote lacunar infarct in the left basal ganglia. No hydrocephalus. The basilar cisterns are patent. No evidence of territorial infarct or acute ischemia. No extra-axial or intracranial fluid collection. Vascular: Atherosclerosis of skullbase vasculature without hyperdense vessel or abnormal calcification. Skull: No fracture or focal lesion. Sinuses/Orbits: Mucosal thickening of the ethmoid air cells and right greater than left maxillary sinus. Left mastoid air cells are hypo pneumatized. No acute findings. Left lens extraction. Other: None. IMPRESSION: 1. No acute intracranial abnormality. No skull fracture. 2. Generalized atrophy and chronic small vessel ischemia. Remote lacunar infarct in the left basal ganglia. Electronically Signed   By: Keith Rake M.D.   On: 01/14/2020 21:44   CT CERVICAL SPINE WO CONTRAST  Result Date:  01/14/2020 CLINICAL DATA:  Witnessed fall today.  Dementia patient. EXAM: CT CERVICAL SPINE WITHOUT CONTRAST TECHNIQUE: Multidetector CT imaging of the cervical spine was performed without intravenous contrast. Multiplanar CT image reconstructions were also generated. COMPARISON:  None. FINDINGS: Alignment: Trace anterolisthesis of C7 on T1 is likely facet mediated on degenerative. No traumatic subluxation. No jumped or perched facets. C1 is well aligned with C2. Skull base and vertebrae: No acute fracture. Vertebral body heights are maintained. The dens and skull base are intact. Soft tissues and spinal canal: No prevertebral fluid or swelling. No visible canal hematoma. Disc levels: Mild diffuse degenerative disc disease with disc space narrowing and endplate spurring. There is multilevel facet hypertrophy. Upper chest: 17 mm hypodense nodule in the left lobe of the thyroid gland. No acute findings. Other: Carotid calcifications. IMPRESSION: 1. Mild degenerative change in the cervical spine without acute fracture or subluxation. 2. A 17 mm hypodense nodule in the left lobe of the thyroid  gland. In the setting of significant comorbidities or limited life expectancy, no follow-up recommended (ref: J Am Coll Radiol. 2015 Feb;12(2): 143-50). Electronically Signed   By: Keith Rake M.D.   On: 01/14/2020 21:47   CT HIP RIGHT WO CONTRAST  Result Date: 01/14/2020 CLINICAL DATA:  Right hip fracture EXAM: CT OF THE RIGHT HIP WITHOUT CONTRAST TECHNIQUE: Multidetector CT imaging of the right hip was performed according to the standard protocol. Multiplanar CT image reconstructions were also generated. COMPARISON:  Radiograph same day FINDINGS: Bones/Joint/Cartilage There is a comminuted impacted fracture of the transcervical right femoral neck. There is superior and anterior subluxation with angulation of the femoral shaft. The femoral head is still well seated within the acetabulum. There is small fracture fragment  seen adjacent to the femoral neck. A sclerotic foci seen within the inferior femoral head. There is diffuse osteopenia. No other definite fractures are identified. Ligaments Suboptimally assessed by CT. Muscles and Tendons The muscles surrounding the hip normal appearance without evidence of focal atrophy or tear. The visualized portions of the tendons appear to be grossly intact. Soft tissues Soft tissue swelling seen over the lateral aspect of the hip. Colonic diverticula are noted without diverticulitis. IMPRESSION: Comminuted impacted displaced anteriorly angulated transcervical right femoral neck fracture. Electronically Signed   By: Prudencio Pair M.D.   On: 01/14/2020 23:02   DG Hip Unilat  With Pelvis 2-3 Views Right  Result Date: 01/14/2020 CLINICAL DATA:  Status post fall. EXAM: DG HIP (WITH OR WITHOUT PELVIS) 2-3V RIGHT COMPARISON:  None. FINDINGS: Acute fracture deformity is seen extending through the neck of the proximal right femur. Approximately 1/2 shaft width superior displacement of the distal fracture site is seen. There is no evidence of dislocation. There is no evidence of significant arthropathy or other focal bone abnormality. IMPRESSION: Acute fracture of the proximal right femur. Electronically Signed   By: Virgina Norfolk M.D.   On: 01/14/2020 21:26   DG FEMUR, MIN 2 VIEWS RIGHT  Result Date: 01/14/2020 CLINICAL DATA:  Status post fall. EXAM: RIGHT FEMUR 2 VIEWS COMPARISON:  None. FINDINGS: Acute fracture deformity is seen extending through the neck of the proximal right femur. Approximately 1/2 shaft width superior displacement of the distal fracture site is noted. There is no evidence of associated dislocation. Soft tissue structures are unremarkable. IMPRESSION: Acute fracture of the proximal right femur. Electronically Signed   By: Virgina Norfolk M.D.   On: 01/14/2020 21:27    All pertinent xrays, MRI, CT independently reviewed and interpreted  Positive ROS: All other  systems have been reviewed and were otherwise negative with the exception of those mentioned in the HPI and as above.  Physical Exam: General: No acute distress Cardiovascular: No pedal edema Respiratory: No cyanosis, no use of accessory musculature GI: No organomegaly, abdomen is soft and non-tender Skin: No lesions in the area of chief complaint Neurologic: Sensation intact distally Psychiatric: Patient is at baseline mood and affect Lymphatic: No axillary or cervical lymphadenopathy  MUSCULOSKELETAL:  - severe pain with movement of the hip and extremity - skin intact - NVI distally - compartments soft  Assessment: Right femoral neck fracture  Plan: - surgical treatment is recommended for pain relief, quality of life and early mobilization - family is aware of r/b/a and wish to proceed, informed consent obtained - medical optimization per primary team - surgery is planned for this morning  Thank you for the consult and the opportunity to see Ms. Armon  [name],  MD Marga Hoots 7:39 AM

## 2020-01-15 NOTE — ED Notes (Signed)
ED TO INPATIENT HANDOFF REPORT  ED Nurse Name and Phone #: Sherlyn Lick [phone]  S Name/Age/Gender Janet [name] 85 y.o. female Room/Bed: WA15/WA15  Code Status   Code Status: Full Code  Home/SNF/Other Nursing Home Patient oriented to: self Is this baseline? Yes   Triage Complete: Triage complete  Chief Complaint Hip fracture [facility]) [S72.009A]  Triage Note Patient arrives via EMS from [facility] s/p fall. Patient had a witnessed fall in the hallway. Unknown if she hit her head. No blood thinners, no head pain. Patient complaining of right hip pain, deformity noted. Patient is dementia, mostly non-verbal. Per staff, patient asks for snacks and says Hello and that is her baseline.     Allergies Allergies  Allergen Reactions   Aspirin Nausea And Vomiting   Penicillins Hives   Sulfa Antibiotics Nausea And Vomiting    Level of Care/Admitting Diagnosis ED Disposition    ED Disposition Condition Comment   Admit  Hospital Area: [facility] [[id number]]  Level of Care: Med-Surg [16]  May admit patient to [facility] or [facility] if equivalent level of care is available:: No  Covid Evaluation: Asymptomatic Screening Protocol (No Symptoms)  Admission Type: Emergency [1]  Diagnosis: Hip fracture [facility]) [[id number]]  Admitting Physician: Shela Leff [[id number]]  Attending Physician: Shela Leff [[id number]]  Estimated length of stay: past midnight tomorrow  Certification:: I certify this patient will need inpatient services for at least 2 midnights       B Medical/Surgery History Past Medical History:  Diagnosis Date   Cancer ([place])    High cholesterol    Neuralgia facialis vera    TIA (transient ischemic attack)    History reviewed. No pertinent surgical history.   A IV Location/Drains/Wounds Patient Lines/Drains/Airways Status    Active Line/Drains/Airways    Name Placement date Placement time Site Days   Peripheral IV 01/14/20  Right;Posterior Forearm 01/14/20  2147  Forearm  less than 1          Intake/Output Last 24 hours No intake or output data in the 24 hours ending 01/14/20 2358  Labs/Imaging Results for orders placed or performed during the hospital encounter of 01/14/20 (from the past 48 hour(s))  Type and screen [place]     Status: None   Collection Time: 01/14/20  9:47 PM  Result Value Ref Range   ABO/RH(D) O NEG    Antibody Screen NEG    Sample Expiration      01/17/2020,2359 Performed at [facility], [place] [address] [place], [place] [address]   Basic metabolic panel     Status: Abnormal   Collection Time: 01/14/20  9:49 PM  Result Value Ref Range   Sodium 142 135 - 145 mmol/L   Potassium 3.5 3.5 - 5.1 mmol/L   Chloride 104 98 - 111 mmol/L   CO2 27 22 - 32 mmol/L   Glucose, Bld 144 (H) 70 - 99 mg/dL    Comment: Glucose reference range applies only to samples taken after fasting for at least 8 hours.   BUN 28 (H) 8 - 23 mg/dL   Creatinine, Ser 0.90 0.44 - 1.00 mg/dL   Calcium 8.8 (L) 8.9 - 10.3 mg/dL   GFR calc non Af Amer 60 (L) >60 mL/min   GFR calc Af Amer >60 >60 mL/min   Anion gap 11 5 - 15    Comment: Performed at [facility], [place] [address] [place], [place] [address]  CBC  WITH DIFFERENTIAL     Status: Abnormal   Collection Time: 01/14/20  9:49 PM  Result Value Ref Range   WBC 10.4 4.0 - 10.5 K/uL   RBC 3.87 3.87 - 5.11 MIL/uL   Hemoglobin 11.5 (L) 12.0 - 15.0 g/dL   HCT 35.1 (L) 36 - 46 %   MCV 90.7 80.0 - 100.0 fL   MCH 29.7 26.0 - 34.0 pg   MCHC 32.8 30.0 - 36.0 g/dL   RDW 12.4 11.5 - 15.5 %   Platelets 123 (L) 150 - 400 K/uL    Comment: Immature Platelet Fraction may be clinically indicated, consider ordering this additional test [id number] REPEATED TO VERIFY    nRBC 0.0 0.0 - 0.2 %   Neutrophils Relative % 74 %   Neutro Abs 7.7 1.7 - 7.7 K/uL   Lymphocytes Relative 19 %   Lymphs Abs 1.9 0.7 - 4.0 K/uL    Monocytes Relative 5 %   Monocytes Absolute 0.6 0 - 1 K/uL   Eosinophils Relative 1 %   Eosinophils Absolute 0.1 0 - 0 K/uL   Basophils Relative 0 %   Basophils Absolute 0.0 0 - 0 K/uL   Immature Granulocytes 1 %   Abs Immature Granulocytes 0.05 0.00 - 0.07 K/uL    Comment: Performed at [facility], [place] [address] [place], [place] [address]  Protime-INR     Status: None   Collection Time: 01/14/20  9:49 PM  Result Value Ref Range   Prothrombin Time 13.0 11.4 - 15.2 seconds   INR 1.0 0.8 - 1.2    Comment: (NOTE) INR goal varies based on device and disease states. Performed at [facility], [place] [address], [place], [place] [address]   ABO/Rh     Status: None   Collection Time: 01/14/20  9:49 PM  Result Value Ref Range   ABO/RH(D)      Jenetta Downer NEG Performed at [place] [address], [place], [place] [address]   SARS Coronavirus 2 by RT PCR (hospital order, performed in [facility] hospital lab) Nasopharyngeal Nasopharyngeal Swab     Status: None   Collection Time: 01/14/20 10:33 PM   Specimen: Nasopharyngeal Swab  Result Value Ref Range   SARS Coronavirus 2 NEGATIVE NEGATIVE    Comment: (NOTE) SARS-CoV-2 target nucleic acids are NOT DETECTED.  The SARS-CoV-2 RNA is generally detectable in upper and lower respiratory specimens during the acute phase of infection. The lowest concentration of SARS-CoV-2 viral copies this assay can detect is 250 copies / mL. A negative result does not preclude SARS-CoV-2 infection and should not be used as the sole basis for treatment or other patient management decisions.  A negative result may occur with improper specimen collection / handling, submission of specimen other than nasopharyngeal swab, presence of viral mutation(s) within the areas targeted by this assay, and inadequate number of viral copies (<250 copies / mL). A negative result must be combined with  clinical observations, patient history, and epidemiological information.  Fact Sheet for Patients:   StrictlyIdeas.no  Fact Sheet for Healthcare Providers: BankingDealers.co.za  This test is not yet approved or  cleared by the [place] FDA and has been authorized for detection and/or diagnosis of SARS-CoV-2 by FDA under an Emergency Use Authorization (EUA).  This EUA will remain in effect (meaning this test can be used) for the duration of the COVID-19 declaration under Section 564(b)(1) of the Act, 21 U.S.C. section 360bbb-3(b)(1), unless the authorization  is terminated or revoked sooner.  Performed at [facility], [place] [address], [place], [place] [address]    DG Chest 1 View  Result Date: 01/14/2020 CLINICAL DATA:  Status post fall. EXAM: CHEST  1 VIEW COMPARISON:  None. FINDINGS: There is no evidence of acute infiltrate, pleural effusion or pneumothorax. The heart size and mediastinal contours are within normal limits. The visualized skeletal structures are unremarkable. IMPRESSION: No active disease. Electronically Signed   By: Virgina Norfolk M.D.   On: 01/14/2020 21:28   CT HEAD WO CONTRAST  Result Date: 01/14/2020 CLINICAL DATA:  Witnessed fall today.  Dementia patient. EXAM: CT HEAD WITHOUT CONTRAST TECHNIQUE: Contiguous axial images were obtained from the base of the skull through the vertex without intravenous contrast. COMPARISON:  Most recent head CT 07/12/2011 FINDINGS: Brain: No intracranial hemorrhage, mass effect, or midline shift. Moderate generalized atrophy and chronic small vessel ischemia. Remote lacunar infarct in the left basal ganglia. No hydrocephalus. The basilar cisterns are patent. No evidence of territorial infarct or acute ischemia. No extra-axial or intracranial fluid collection. Vascular: Atherosclerosis of skullbase vasculature without hyperdense vessel or abnormal calcification.  Skull: No fracture or focal lesion. Sinuses/Orbits: Mucosal thickening of the ethmoid air cells and right greater than left maxillary sinus. Left mastoid air cells are hypo pneumatized. No acute findings. Left lens extraction. Other: None. IMPRESSION: 1. No acute intracranial abnormality. No skull fracture. 2. Generalized atrophy and chronic small vessel ischemia. Remote lacunar infarct in the left basal ganglia. Electronically Signed   By: Keith Rake M.D.   On: 01/14/2020 21:44   CT CERVICAL SPINE WO CONTRAST  Result Date: 01/14/2020 CLINICAL DATA:  Witnessed fall today.  Dementia patient. EXAM: CT CERVICAL SPINE WITHOUT CONTRAST TECHNIQUE: Multidetector CT imaging of the cervical spine was performed without intravenous contrast. Multiplanar CT image reconstructions were also generated. COMPARISON:  None. FINDINGS: Alignment: Trace anterolisthesis of C7 on T1 is likely facet mediated on degenerative. No traumatic subluxation. No jumped or perched facets. C1 is well aligned with C2. Skull base and vertebrae: No acute fracture. Vertebral body heights are maintained. The dens and skull base are intact. Soft tissues and spinal canal: No prevertebral fluid or swelling. No visible canal hematoma. Disc levels: Mild diffuse degenerative disc disease with disc space narrowing and endplate spurring. There is multilevel facet hypertrophy. Upper chest: 17 mm hypodense nodule in the left lobe of the thyroid gland. No acute findings. Other: Carotid calcifications. IMPRESSION: 1. Mild degenerative change in the cervical spine without acute fracture or subluxation. 2. A 17 mm hypodense nodule in the left lobe of the thyroid gland. In the setting of significant comorbidities or limited life expectancy, no follow-up recommended (ref: J Am Coll Radiol. 2015 Feb;12(2): 143-50). Electronically Signed   By: Keith Rake M.D.   On: 01/14/2020 21:47   CT HIP RIGHT WO CONTRAST  Result Date: 01/14/2020 CLINICAL DATA:   Right hip fracture EXAM: CT OF THE RIGHT HIP WITHOUT CONTRAST TECHNIQUE: Multidetector CT imaging of the right hip was performed according to the standard protocol. Multiplanar CT image reconstructions were also generated. COMPARISON:  Radiograph same day FINDINGS: Bones/Joint/Cartilage There is a comminuted impacted fracture of the transcervical right femoral neck. There is superior and anterior subluxation with angulation of the femoral shaft. The femoral head is still well seated within the acetabulum. There is small fracture fragment seen adjacent to the femoral neck. A sclerotic foci seen within the inferior femoral head. There is diffuse osteopenia. No other  definite fractures are identified. Ligaments Suboptimally assessed by CT. Muscles and Tendons The muscles surrounding the hip normal appearance without evidence of focal atrophy or tear. The visualized portions of the tendons appear to be grossly intact. Soft tissues Soft tissue swelling seen over the lateral aspect of the hip. Colonic diverticula are noted without diverticulitis. IMPRESSION: Comminuted impacted displaced anteriorly angulated transcervical right femoral neck fracture. Electronically Signed   By: Prudencio Pair M.D.   On: 01/14/2020 23:02   DG Hip Unilat  With Pelvis 2-3 Views Right  Result Date: 01/14/2020 CLINICAL DATA:  Status post fall. EXAM: DG HIP (WITH OR WITHOUT PELVIS) 2-3V RIGHT COMPARISON:  None. FINDINGS: Acute fracture deformity is seen extending through the neck of the proximal right femur. Approximately 1/2 shaft width superior displacement of the distal fracture site is seen. There is no evidence of dislocation. There is no evidence of significant arthropathy or other focal bone abnormality. IMPRESSION: Acute fracture of the proximal right femur. Electronically Signed   By: Virgina Norfolk M.D.   On: 01/14/2020 21:26   DG FEMUR, MIN 2 VIEWS RIGHT  Result Date: 01/14/2020 CLINICAL DATA:  Status post fall. EXAM: RIGHT  FEMUR 2 VIEWS COMPARISON:  None. FINDINGS: Acute fracture deformity is seen extending through the neck of the proximal right femur. Approximately 1/2 shaft width superior displacement of the distal fracture site is noted. There is no evidence of associated dislocation. Soft tissue structures are unremarkable. IMPRESSION: Acute fracture of the proximal right femur. Electronically Signed   By: Virgina Norfolk M.D.   On: 01/14/2020 21:27    Pending Labs Unresulted Labs (From admission, onward) Comment          Start     Ordered   01/15/20 0500  TSH  Tomorrow morning,   R        01/14/20 2305          Vitals/Pain Today's Vitals   01/14/20 2031 01/14/20 2049 01/14/20 2215  BP:  (!) 152/137 (!) 170/72  Pulse:  (!) 59 64  Resp:  17 15  Temp:  99.2 F (37.3 C)   TempSrc:  Oral   SpO2: 94% 99% 90%    Isolation Precautions No active isolations  Medications Medications  atorvastatin (LIPITOR) tablet 40 mg (has no administration in time range)  metoprolol succinate (TOPROL-XL) 24 hr tablet 25 mg (has no administration in time range)  donepezil (ARICEPT) tablet 10 mg (has no administration in time range)  escitalopram (LEXAPRO) tablet 10 mg (has no administration in time range)  mirtazapine (REMERON) tablet 7.5 mg (has no administration in time range)  alum & mag hydroxide-simeth (MAALOX/MYLANTA) 200-200-20 MG/5ML suspension 30 mL (has no administration in time range)  magnesium hydroxide (MILK OF MAGNESIA) suspension 30 mL (has no administration in time range)  darifenacin (ENABLEX) 24 hr tablet 7.5 mg (has no administration in time range)  divalproex (DEPAKOTE) DR tablet 250 mg (has no administration in time range)  cholecalciferol (VITAMIN D3) tablet 1,000 Units (has no administration in time range)  multivitamin with minerals tablet 1 tablet (has no administration in time range)  zinc sulfate capsule 220 mg (has no administration in time range)  HYDROcodone-acetaminophen  (NORCO/VICODIN) 5-325 MG per tablet 1-2 tablet (has no administration in time range)  morphine 2 MG/ML injection 0.5 mg (has no administration in time range)  0.9 %  sodium chloride infusion (has no administration in time range)  fentaNYL (SUBLIMAZE) injection 50 mcg (50 mcg Intravenous Given 01/14/20 2235)  ondansetron [facility]) injection 4 mg (4 mg Intravenous Given 01/14/20 2221)    Mobility non-ambulatory High fall risk   Focused Assessments    R Recommendations: See Admitting Provider Note  Report given to:   Additional Notes:  Patient has dementia, A&O x1 at baseline

## 2020-01-16 LAB — CBC
HCT: 25.1 % — ABNORMAL LOW (ref 36.0–46.0)
Hemoglobin: 8.2 g/dL — ABNORMAL LOW (ref 12.0–15.0)
MCH: 29.8 pg (ref 26.0–34.0)
MCHC: 32.7 g/dL (ref 30.0–36.0)
MCV: 91.3 fL (ref 80.0–100.0)
Platelets: 100 10*3/uL — ABNORMAL LOW (ref 150–400)
RBC: 2.75 MIL/uL — ABNORMAL LOW (ref 3.87–5.11)
RDW: 13 % (ref 11.5–15.5)
WBC: 10 10*3/uL (ref 4.0–10.5)
nRBC: 0 % (ref 0.0–0.2)

## 2020-01-16 LAB — BASIC METABOLIC PANEL
Anion gap: 11 (ref 5–15)
BUN: 27 mg/dL — ABNORMAL HIGH (ref 8–23)
CO2: 24 mmol/L (ref 22–32)
Calcium: 8.1 mg/dL — ABNORMAL LOW (ref 8.9–10.3)
Chloride: 105 mmol/L (ref 98–111)
Creatinine, Ser: 0.87 mg/dL (ref 0.44–1.00)
GFR calc Af Amer: >60 mL/min (ref >60)
GFR calc non Af Amer: >60 mL/min (ref >60)
Glucose, Bld: 141 mg/dL — ABNORMAL HIGH (ref 70–99)
Potassium: 3.6 mmol/L (ref 3.5–5.1)
Sodium: 140 mmol/L (ref 135–145)

## 2020-01-16 NOTE — Evaluation (Addendum)
Physical Therapy Evaluation Patient Details Name: Janet [name] MRN: [id number] DOB: 1934-10-09 Today's Date: 01/16/2020   History of Present Illness  Pt is a 85 y.o. F with significant PMH of CA, TIA, severe dementia who presents with right hip fracture after a mechanical fall. S/p hip hemiarthroplasty (anterior approach).  Clinical Impression  Evaluation limited due to cognitive deficits, pain and agitation. Currently, pt extremely hypersensitive to even minimal palpation and bed movement. Placed bed in chair position to promote upright; pt able to drink with min assist and dependent for taking a couple bites of Jello. Gently repositioned to promote RLE in neutral position. Music, quiet environment, and natural light  incorporated to promote relaxation. It will be very difficult to mobilize this pt given these issues. Will continue attempts.     Follow Up Recommendations SNF    Equipment Recommendations  Other (comment) (defer)    Recommendations for Other Services       Precautions / Restrictions Precautions Precautions: Fall;Posterior Hip (Per order set "if hemiarthroplasty, follow posterior hip precautions.") Precaution Booklet Issued: No Restrictions Weight Bearing Restrictions: No RLE Weight Bearing: Weight bearing as tolerated      Mobility  Bed Mobility Overal bed mobility: Needs Assistance             General bed mobility comments: Bed placed in chair position to promote upright. Limited secondary to pain  Transfers                 General transfer comment: deferred  Ambulation/Gait                Stairs            Wheelchair Mobility    Modified Rankin (Stroke Patients Only)       Balance                                             Pertinent Vitals/Pain Pain Assessment: Faces Faces Pain Scale: Hurts worst Pain Location: RLE with minimal palpation or movement Pain Descriptors / Indicators:  Guarding;Moaning Pain Intervention(s): Limited activity within patient's tolerance;Monitored during session;Repositioned    Home Living Family/patient expects to be discharged to:: Skilled nursing facility                 Additional Comments: From [facility]    Prior Function Level of Independence: Needs assistance   Gait / Transfers Assistance Needed: Per chart review, was ambulatory  ADL's / Homemaking Assistance Needed: Likely requiring assist at ALF        Hand Dominance        Extremity/Trunk Assessment   Upper Extremity Assessment Upper Extremity Assessment: Defer to OT evaluation    Lower Extremity Assessment Lower Extremity Assessment: RLE deficits/detail;LLE deficits/detail RLE Deficits / Details: Hip fx s/p hip hemiarthroplasty. Unable to palpate or assess due to pain LLE Deficits / Details: Passive ankle dorsiflexion limited 10 degrees from neutral       Communication   Communication: No difficulties  Cognition Arousal/Alertness: Awake/alert Behavior During Therapy: Anxious Overall Cognitive Status: History of cognitive impairments - at baseline                                 General Comments: History of severe dementia. Pt very anxious, stating "don't kill me," unable to command  follow. Able to perform some functional tasks such as drinking with min assist.      General Comments      Exercises     Assessment/Plan    PT Assessment Patient needs continued PT services  PT Problem List Decreased strength;Decreased range of motion;Decreased activity tolerance;Decreased mobility;Decreased balance;Decreased cognition;Decreased safety awareness;Pain       PT Treatment Interventions DME instruction;Gait training;Functional mobility training;Balance training;Therapeutic activities;Therapeutic exercise;Patient/family education;Wheelchair mobility training    PT Goals (Current goals can be found in the Care Plan section)  Acute  Rehab PT Goals Patient Stated Goal: unable PT Goal Formulation: Patient unable to participate in goal setting Time For Goal Achievement: 01/30/20 Potential to Achieve Goals: Fair    Frequency Min 3X/week   Barriers to discharge        Co-evaluation PT/OT/SLP Co-Evaluation/Treatment: Yes Reason for Co-Treatment: Complexity of the patient's impairments (multi-system involvement);Necessary to address cognition/behavior during functional activity;For patient/therapist safety;Other (comment) (severe dementia) PT goals addressed during session: Strengthening/ROM;Mobility/safety with mobility         AM-PAC PT "6 Clicks" Mobility  Outcome Measure Help needed turning from your back to your side while in a flat bed without using bedrails?: Total Help needed moving from lying on your back to sitting on the side of a flat bed without using bedrails?: Total Help needed moving to and from a bed to a chair (including a wheelchair)?: Total Help needed standing up from a chair using your arms (e.g., wheelchair or bedside chair)?: Total Help needed to walk in hospital room?: Total Help needed climbing 3-5 steps with a railing? : Total 6 Click Score: 6    End of Session   Activity Tolerance: Patient limited by pain;Other (comment) (cognition) Patient left: in bed;with call bell/phone within reach;with bed alarm set Nurse Communication: Mobility status;Other (comment) (pain meds) PT Visit Diagnosis: Pain;Other abnormalities of gait and mobility (R26.89);Muscle weakness (generalized) (M62.81);History of falling (Z91.81) Pain - Right/Left: Right Pain - part of body: Hip    Time: 1610-9604 PT Time Calculation (min) (ACUTE ONLY): 25 min   Charges:   PT Evaluation $PT Eval Moderate Complexity: 1 Mod            Janet [name], PT, DPT Acute Rehabilitation Services Pager [phone] Office [phone]   Deno Etienne 01/16/2020, 11:52 AM

## 2020-01-16 NOTE — Evaluation (Signed)
Occupational Therapy Evaluation Patient Details Name: Janet [name] MRN: [id number] DOB: 1935-07-09 Today's Date: 01/16/2020    History of Present Illness Pt is a 85 y.o. F with significant PMH of [place], TIA, severe dementia who presents with right hip fracture after a mechanical fall. S/p hip hemiarthroplasty (anterior approach).   Clinical Impression   Pt admitted with the above diagnoses and presents with below problem list. Pt will benefit from continued acute OT to address the below listed deficits and maximize independence with basic ADLs prior to d/c to venue below. At baseline, pt was at least somewhat ambulatory, likely required assist with ADLs 2/2 advanced dementia. Session limited by 10/10 pain in RLE with light touch. Pt agitated and yelling at times; fearful "don't hurt me." Provided quiet space, music, natural light and calm environment. Pt ultimately able to tolerate HOB elevated upright to simulate chair position after initially being upset while bed repositioning. Unable to attempt bed mobility this session due to pain and agitation. Resting peacefully at end of session.       Follow Up Recommendations  SNF (hopefully back to [facility] with OT/PT there)    Equipment Recommendations  None recommended by OT    Recommendations for Other Services       Precautions / Restrictions Precautions Precautions: Fall;Posterior Hip Precaution Booklet Issued: No Restrictions Weight Bearing Restrictions: No RLE Weight Bearing: Weight bearing as tolerated      Mobility Bed Mobility Overal bed mobility: Needs Assistance             General bed mobility comments: Bed placed in chair position to promote upright. Limited secondary to pain  Transfers                 General transfer comment: deferred    Balance                                           ADL either performed or assessed with clinical judgement   ADL Overall ADL's : Needs  assistance/impaired Eating/Feeding: Maximal assistance;Bed level Eating/Feeding Details (indicate cue type and reason): at times able to hold cup and drink from straw with min A, max A for throughness, safety and initiaition with task.  Grooming: Total assistance   Upper Body Bathing: Total assistance   Lower Body Bathing: Total assistance;+2 for physical assistance;Bed level   Upper Body Dressing : Total assistance;Bed level   Lower Body Dressing: Total assistance;+2 for physical assistance;Bed level                 General ADL Comments: Currently total A, bed level with basic ADLs. Evaluation limited due to 10/10 FACEs pain with light touch and behaviors 2/2 advanced dementia. Did not enjoy [place] being elevated to simulate chair position but with some redirection able to relax and eat a few bites in this position. Pt resting in elevated HOB position at end of session. bed alarm on, blinds open. No family present this session     Vision         Perception     Praxis      Pertinent Vitals/Pain Pain Assessment: Faces Faces Pain Scale: Hurts worst Pain Location: RLE with minimal palpation or movement Pain Descriptors / Indicators: Guarding;Moaning Pain Intervention(s): Limited activity within patient's tolerance;Monitored during session;Repositioned     Hand Dominance     Extremity/Trunk Assessment Upper Extremity Assessment  Upper Extremity Assessment: Generalized weakness;Difficult to assess due to impaired cognition   Lower Extremity Assessment Lower Extremity Assessment: Defer to PT evaluation RLE Deficits / Details: Hip fx s/p hip hemiarthroplasty. Unable to palpate or assess due to pain LLE Deficits / Details: Passive ankle dorsiflexion limited 10 degrees from neutral       Communication Communication Communication: No difficulties   Cognition Arousal/Alertness: Awake/alert Behavior During Therapy: Anxious Overall Cognitive Status: History of cognitive  impairments - at baseline                                 General Comments: History of severe dementia. Pt very anxious, stating "don't kill me," unable to command follow. Able to perform some functional tasks such as drinking with min assist.   General Comments       Exercises     Shoulder Instructions      Home Living Family/patient expects to be discharged to:: Skilled nursing facility                                 Additional Comments: From [facility]      Prior Functioning/Environment Level of Independence: Needs assistance  Gait / Transfers Assistance Needed: Per chart review, was ambulatory ADL's / Homemaking Assistance Needed: Likely requiring assist at ALF            OT Problem List: Decreased strength;Decreased activity tolerance;Impaired balance (sitting and/or standing);Decreased cognition;Decreased safety awareness;Decreased knowledge of precautions;Decreased knowledge of use of DME or AE;Pain      OT Treatment/Interventions: Self-care/ADL training;Therapeutic exercise;DME and/or AE instruction;Therapeutic activities;Cognitive remediation/compensation;Patient/family education;Balance training    OT Goals(Current goals can be found in the care plan section) Acute Rehab OT Goals Patient Stated Goal: unable OT Goal Formulation: Patient unable to participate in goal setting Time For Goal Achievement: 01/30/20 Potential to Achieve Goals: Poor ADL Goals Additional ADL Goal #1: Pt will complete rolling at max A level to facilitate bed level pericare. Additional ADL Goal #2: Pt will complete 2 ADL tasks with max A with bed in chair position. Additional ADL Goal #3: Pt will complete 2 bed level, pre-transfer activites with max A.  OT Frequency: Min 2X/week   Barriers to D/C:            Co-evaluation PT/OT/SLP Co-Evaluation/Treatment: Yes Reason for Co-Treatment: Complexity of the patient's impairments (multi-system  involvement);Necessary to address cognition/behavior during functional activity;For patient/therapist safety (advanced dementia) PT goals addressed during session: Strengthening/ROM;Mobility/safety with mobility OT goals addressed during session: ADL's and self-care      AM-PAC OT "6 Clicks" Daily Activity     Outcome Measure Help from another person eating meals?: Total Help from another person taking care of personal grooming?: Total Help from another person toileting, which includes using toliet, bedpan, or urinal?: Total Help from another person bathing (including washing, rinsing, drying)?: Total Help from another person to put on and taking off regular upper body clothing?: Total Help from another person to put on and taking off regular lower body clothing?: Total 6 Click Score: 6   End of Session    Activity Tolerance: Patient limited by pain;Treatment limited secondary to agitation Patient left: in bed;with call bell/phone within reach;with bed alarm set (HOB elevated to simulate sitting up in chair)  OT Visit Diagnosis: Unsteadiness on feet (R26.81);Muscle weakness (generalized) (M62.81);Other abnormalities of gait and mobility (  R26.89);History of falling (Z91.81);Other symptoms and signs involving cognitive function;Pain                Time: 5176-1607 OT Time Calculation (min): 25 min Charges:  OT General Charges $OT Visit: 1 Visit OT Evaluation $OT Eval Moderate Complexity: [place], OT Acute Rehabilitation Services Pager: [phone] Office: [phone]   Hortencia Pilar 01/16/2020, 12:24 PM

## 2020-01-16 NOTE — Plan of Care (Signed)
  Problem: Clinical Measurements: Goal: Ability to maintain clinical measurements within normal limits will improve Outcome: Progressing Goal: Will remain free from infection Outcome: Progressing Goal: Diagnostic test results will improve Outcome: Progressing Goal: Respiratory complications will improve Outcome: Progressing Goal: Cardiovascular complication will be avoided Outcome: Progressing   Problem: Coping: Goal: Level of anxiety will decrease Outcome: Progressing   Problem: Elimination: Goal: Will not experience complications related to bowel motility Outcome: Progressing Goal: Will not experience complications related to urinary retention Outcome: Progressing   Problem: Pain Managment: Goal: General experience of comfort will improve Outcome: Progressing   Problem: Safety: Goal: Ability to remain free from injury will improve Outcome: Progressing   Problem: Skin Integrity: Goal: Risk for impaired skin integrity will decrease Outcome: Progressing   

## 2020-01-16 NOTE — Progress Notes (Signed)
Patient spitted out all scheduled meds for 2200, meds were crushed in apple sauce. Pt. Is unable to follow instructions and disoriented d/t AMS. She states "don't hurt me" during communication or interaction but stops when reassured and writer leaves room. Pt. was given PRN MSO4 05.mg  IV for pain and is effective. Unable to use SCD's d/t anxiety and agitation. Will cont. To monitor.

## 2020-01-16 NOTE — Plan of Care (Signed)
  Problem: Education: Goal: Knowledge of General Education information will improve Description: Including pain rating scale, medication(s)/side effects and non-pharmacologic comfort measures Outcome: Progressing   Problem: Clinical Measurements: Goal: Will remain free from infection Outcome: Progressing Goal: Respiratory complications will improve Outcome: Progressing Goal: Cardiovascular complication will be avoided Outcome: Progressing   Problem: Coping: Goal: Level of anxiety will decrease Outcome: Progressing   Problem: Elimination: Goal: Will not experience complications related to bowel motility Outcome: Progressing Goal: Will not experience complications related to urinary retention Outcome: Progressing   Problem: Pain Managment: Goal: General experience of comfort will improve Outcome: Progressing   Problem: Safety: Goal: Ability to remain free from injury will improve Outcome: Progressing   Problem: Skin Integrity: Goal: Risk for impaired skin integrity will decrease Outcome: Progressing

## 2020-01-16 NOTE — Progress Notes (Signed)
Patient ID: Janet [name], female   DOB: [birth year], 85 y.o.   MRN: [id number]  PROGRESS NOTE    Raven Furnas  DJM:[id number] DOB: 1935/02/28 DOA: 01/14/2020 PCP: Prince Solian, MD    Brief Narrative:  85 year old female with severe dementia, TIA and hyperlipidemia brought to [facility] from nursing home for right hip pain after witnessed mechanical fall.  She was transferred to [facility] for surgical intervention.  Patient underwent prosthetic replacement for femoral neck fracture by Dr. Erlinda Hong on 01/15/2020.   Assessment & Plan:   Principal Problem:   Closed displaced fracture of right femoral neck (HCC) Active Problems:   High cholesterol   Thyroid nodule   Thrombocytopenia (HCC)   Dementia (HCC)  "Witnessed mechanical" fall at nursing home-multiple potential causes including severe dementia and meds Comminuted impacted displaced closed right femoral neck fracture -s/p prosthetic replacement for femoral neck fracture by Dr. Erlinda Hong on 01/15/1920 -Pain control, activity and VTE prophylaxis per Ortho recommendations  Severe dementia without behavioral disturbance -Continue home medications -Reorientation, delirium and fall precautions  Chronic thrombocytopenia: At baseline -Monitor as appropriate  Thyroid nodule: 17 mm hypodense nodule in the left lobe of thyroid gland noted.  Unclear significance of this. -Check TSH -Follow thyroid ultrasound-ordered on admission.  Hyperlipidemia-on Lipitor 40 mg daily.  Unclear indication.  Not diabetic.  No CVA vaccination of TIA discharge. -Recommend discontinuing  Mood disorder: On Depakote, Lexapro, Remeron at home -Continue home meds  Urinary incontinence -Continue home Vesicare  At risk for polypharmacy: On significant amount of sedating medication which would increase her risk of fall -Recommend simplified regimen with essential meds on discharge.   DVT prophylaxis: Lovenox SQ Code Status: DNR  Family Communication: none  today Disposition Plan: SNF   Consultants:   Ortho  Procedures:  Prosthetic replacement of femoral neck fracture   Antimicrobials: Anti-infectives (From admission, onward)   Start     Dose/Rate Route Frequency Ordered Stop   01/15/20 1500  ceFAZolin (ANCEF) IVPB 2g/100 mL premix        2 g 200 mL/hr over 30 Minutes Intravenous Every 6 hours 01/15/20 1156 01/16/20 1036   01/15/20 1200  ceFAZolin (ANCEF) IVPB 2g/100 mL premix  Status:  Discontinued        2 g 200 mL/hr over 30 Minutes Intravenous On call to O.R. 01/15/20 1155 01/15/20 1222   01/15/20 1008  vancomycin (VANCOCIN) powder  Status:  Discontinued          As needed 01/15/20 1008 01/15/20 1008   01/15/20 0802  ceFAZolin (ANCEF) 2-4 GM/100ML-% IVPB       Note to Pharmacy: Henrine Screws   : cabinet override      01/15/20 0802 01/15/20 2014   01/15/20 0800  ceFAZolin (ANCEF) IVPB 2g/100 mL premix  Status:  Discontinued        2 g 200 mL/hr over 30 Minutes Intravenous  Once 01/15/20 0759 01/15/20 1045       Subjective: Sleepy but arousable. Unable to participate in PT/OT activities. Screams to not hurt her.  Objective: Vitals:   01/15/20 1418 01/15/20 1900 01/16/20 0340 01/16/20 0800  BP: (!) 134/50 (!) 137/58 139/86 130/80  Pulse: 64 61 60   Resp: 17 18 15 16   Temp: 98.3 F (36.8 C) 98.1 F (36.7 C) 98.1 F (36.7 C) 98.4 F (36.9 C)  TempSrc: Oral Oral Axillary Axillary  SpO2: 93% 94% 97% 98%    Intake/Output Summary (Last 24 hours) at 01/16/2020 1700 Last data filed at 01/16/2020  0500 Gross per 24 hour  Intake 1319.56 ml  Output 300 ml  Net 1019.56 ml    Examination:  General exam: Appears calm and comfortable  Respiratory system: Clear to auscultation. Respiratory effort normal. Cardiovascular system: S1 & S2 heard, RRR.  Gastrointestinal system: Abdomen is nondistended, soft and nontender.  Central nervous system: Alert and oriented. No focal neurological deficits. Extremities: Symmetric, will  not allow manipulation Skin: No rashes Psychiatry: sleepy, dementia    Data Reviewed: I have personally reviewed following labs and imaging studies  CBC: Recent Labs  Lab 01/14/20 2149 01/15/20 1201 01/16/20 0745  WBC 10.4 10.1 10.0  NEUTROABS 7.7  --   --   HGB 11.5* 10.3* 8.2*  HCT 35.1* 31.6* 25.1*  MCV 90.7 91.1 91.3  PLT 123* 112* 161*   Basic Metabolic Panel: Recent Labs  Lab 01/14/20 2149 01/15/20 1201 01/16/20 0745  NA 142  --  140  K 3.5  --  3.6  CL 104  --  105  CO2 27  --  24  GLUCOSE 144*  --  141*  BUN 28*  --  27*  CREATININE 0.90 0.93 0.87  CALCIUM 8.8*  --  8.1*   Coagulation Profile: Recent Labs  Lab 01/14/20 2149  INR 1.0     Recent Results (from the past 240 hour(s))  SARS Coronavirus 2 by RT PCR (hospital order, performed in [facility] hospital lab) Nasopharyngeal Nasopharyngeal Swab     Status: None   Collection Time: 01/14/20 10:33 PM   Specimen: Nasopharyngeal Swab  Result Value Ref Range Status   SARS Coronavirus 2 NEGATIVE NEGATIVE Final    Comment: (NOTE) SARS-CoV-2 target nucleic acids are NOT DETECTED.  The SARS-CoV-2 RNA is generally detectable in upper and lower respiratory specimens during the acute phase of infection. The lowest concentration of SARS-CoV-2 viral copies this assay can detect is 250 copies / mL. A negative result does not preclude SARS-CoV-2 infection and should not be used as the sole basis for treatment or other patient management decisions.  A negative result may occur with improper specimen collection / handling, submission of specimen other than nasopharyngeal swab, presence of viral mutation(s) within the areas targeted by this assay, and inadequate number of viral copies (<250 copies / mL). A negative result must be combined with clinical observations, patient history, and epidemiological information.  Fact Sheet for Patients:   StrictlyIdeas.no  Fact Sheet for  Healthcare Providers: BankingDealers.co.za  This test is not yet approved or  cleared by the [place] FDA and has been authorized for detection and/or diagnosis of SARS-CoV-2 by FDA under an Emergency Use Authorization (EUA).  This EUA will remain in effect (meaning this test can be used) for the duration of the COVID-19 declaration under Section 564(b)(1) of the Act, 21 U.S.C. section 360bbb-3(b)(1), unless the authorization is terminated or revoked sooner.  Performed at [facility], [place] [address] [place], [place] [address]       Radiology Studies: DG Chest 1 View  Result Date: 01/14/2020 CLINICAL DATA:  Status post fall. EXAM: CHEST  1 VIEW COMPARISON:  None. FINDINGS: There is no evidence of acute infiltrate, pleural effusion or pneumothorax. The heart size and mediastinal contours are within normal limits. The visualized skeletal structures are unremarkable. IMPRESSION: No active disease. Electronically Signed   By: Virgina Norfolk M.D.   On: 01/14/2020 21:28   CT HEAD WO CONTRAST  Result Date: 01/14/2020 CLINICAL DATA:  Witnessed fall today.  Dementia  patient. EXAM: CT HEAD WITHOUT CONTRAST TECHNIQUE: Contiguous axial images were obtained from the base of the skull through the vertex without intravenous contrast. COMPARISON:  Most recent head CT 07/12/2011 FINDINGS: Brain: No intracranial hemorrhage, mass effect, or midline shift. Moderate generalized atrophy and chronic small vessel ischemia. Remote lacunar infarct in the left basal ganglia. No hydrocephalus. The basilar cisterns are patent. No evidence of territorial infarct or acute ischemia. No extra-axial or intracranial fluid collection. Vascular: Atherosclerosis of skullbase vasculature without hyperdense vessel or abnormal calcification. Skull: No fracture or focal lesion. Sinuses/Orbits: Mucosal thickening of the ethmoid air cells and right greater than left maxillary sinus. Left  mastoid air cells are hypo pneumatized. No acute findings. Left lens extraction. Other: None. IMPRESSION: 1. No acute intracranial abnormality. No skull fracture. 2. Generalized atrophy and chronic small vessel ischemia. Remote lacunar infarct in the left basal ganglia. Electronically Signed   By: Keith Rake M.D.   On: 01/14/2020 21:44   CT CERVICAL SPINE WO CONTRAST  Result Date: 01/14/2020 CLINICAL DATA:  Witnessed fall today.  Dementia patient. EXAM: CT CERVICAL SPINE WITHOUT CONTRAST TECHNIQUE: Multidetector CT imaging of the cervical spine was performed without intravenous contrast. Multiplanar CT image reconstructions were also generated. COMPARISON:  None. FINDINGS: Alignment: Trace anterolisthesis of C7 on T1 is likely facet mediated on degenerative. No traumatic subluxation. No jumped or perched facets. C1 is well aligned with C2. Skull base and vertebrae: No acute fracture. Vertebral body heights are maintained. The dens and skull base are intact. Soft tissues and spinal canal: No prevertebral fluid or swelling. No visible canal hematoma. Disc levels: Mild diffuse degenerative disc disease with disc space narrowing and endplate spurring. There is multilevel facet hypertrophy. Upper chest: 17 mm hypodense nodule in the left lobe of the thyroid gland. No acute findings. Other: Carotid calcifications. IMPRESSION: 1. Mild degenerative change in the cervical spine without acute fracture or subluxation. 2. A 17 mm hypodense nodule in the left lobe of the thyroid gland. In the setting of significant comorbidities or limited life expectancy, no follow-up recommended (ref: J Am Coll Radiol. 2015 Feb;12(2): 143-50). Electronically Signed   By: Keith Rake M.D.   On: 01/14/2020 21:47   Pelvis Portable  Result Date: 01/15/2020 CLINICAL DATA:  Right hip fracture post arthroplasty. EXAM: PORTABLE PELVIS 1-2 VIEWS COMPARISON:  01/14/2020 FINDINGS: Examination demonstrates expected changes post right  hip arthroplasty which is intact and normally located. Mild degenerate change of the left hip. No acute fracture or dislocation. Mild degenerative change of the spine. IMPRESSION: Expected changes post right hip arthroplasty. Electronically Signed   By: Marin Olp M.D.   On: 01/15/2020 11:39   CT HIP RIGHT WO CONTRAST  Result Date: 01/14/2020 CLINICAL DATA:  Right hip fracture EXAM: CT OF THE RIGHT HIP WITHOUT CONTRAST TECHNIQUE: Multidetector CT imaging of the right hip was performed according to the standard protocol. Multiplanar CT image reconstructions were also generated. COMPARISON:  Radiograph same day FINDINGS: Bones/Joint/Cartilage There is a comminuted impacted fracture of the transcervical right femoral neck. There is superior and anterior subluxation with angulation of the femoral shaft. The femoral head is still well seated within the acetabulum. There is small fracture fragment seen adjacent to the femoral neck. A sclerotic foci seen within the inferior femoral head. There is diffuse osteopenia. No other definite fractures are identified. Ligaments Suboptimally assessed by CT. Muscles and Tendons The muscles surrounding the hip normal appearance without evidence of focal atrophy or tear. The visualized portions of  the tendons appear to be grossly intact. Soft tissues Soft tissue swelling seen over the lateral aspect of the hip. Colonic diverticula are noted without diverticulitis. IMPRESSION: Comminuted impacted displaced anteriorly angulated transcervical right femoral neck fracture. Electronically Signed   By: Prudencio Pair M.D.   On: 01/14/2020 23:02   DG C-Arm 1-60 Min  Result Date: 01/15/2020 CLINICAL DATA:  Right hip fracture post right hip arthroplasty. EXAM: OPERATIVE RIGHT HIP (WITH PELVIS IF PERFORMED) 3 VIEWS TECHNIQUE: Fluoroscopic spot image(s) were submitted for interpretation post-operatively. COMPARISON:  01/14/2020 FINDINGS: Examination demonstrates evidence of patient's  subcapital right femoral neck fracture with subsequent images demonstrate placement of a right hip arthroplasty intact and normally located. IMPRESSION: Expected changes post right hip arthroplasty. Electronically Signed   By: Marin Olp M.D.   On: 01/15/2020 11:38   DG HIP OPERATIVE UNILAT W OR W/O PELVIS RIGHT  Result Date: 01/15/2020 CLINICAL DATA:  Right hip fracture post right hip arthroplasty. EXAM: OPERATIVE RIGHT HIP (WITH PELVIS IF PERFORMED) 3 VIEWS TECHNIQUE: Fluoroscopic spot image(s) were submitted for interpretation post-operatively. COMPARISON:  01/14/2020 FINDINGS: Examination demonstrates evidence of patient's subcapital right femoral neck fracture with subsequent images demonstrate placement of a right hip arthroplasty intact and normally located. IMPRESSION: Expected changes post right hip arthroplasty. Electronically Signed   By: Marin Olp M.D.   On: 01/15/2020 11:38   DG Hip Unilat  With Pelvis 2-3 Views Right  Result Date: 01/14/2020 CLINICAL DATA:  Status post fall. EXAM: DG HIP (WITH OR WITHOUT PELVIS) 2-3V RIGHT COMPARISON:  None. FINDINGS: Acute fracture deformity is seen extending through the neck of the proximal right femur. Approximately 1/2 shaft width superior displacement of the distal fracture site is seen. There is no evidence of dislocation. There is no evidence of significant arthropathy or other focal bone abnormality. IMPRESSION: Acute fracture of the proximal right femur. Electronically Signed   By: Virgina Norfolk M.D.   On: 01/14/2020 21:26   DG FEMUR, MIN 2 VIEWS RIGHT  Result Date: 01/14/2020 CLINICAL DATA:  Status post fall. EXAM: RIGHT FEMUR 2 VIEWS COMPARISON:  None. FINDINGS: Acute fracture deformity is seen extending through the neck of the proximal right femur. Approximately 1/2 shaft width superior displacement of the distal fracture site is noted. There is no evidence of associated dislocation. Soft tissue structures are unremarkable.  IMPRESSION: Acute fracture of the proximal right femur. Electronically Signed   By: Virgina Norfolk M.D.   On: 01/14/2020 21:27     Scheduled Meds: . atorvastatin  40 mg Oral QPM  . cholecalciferol  1,000 Units Oral Daily  . darifenacin  7.5 mg Oral Daily  . divalproex  250 mg Oral BID  . docusate sodium  100 mg Oral BID  . donepezil  10 mg Oral BID  . enoxaparin (LOVENOX) injection  40 mg Subcutaneous Q24H  . escitalopram  10 mg Oral Daily  . feeding supplement (ENSURE ENLIVE)  237 mL Oral BID BM  . metoprolol succinate  25 mg Oral Daily  . mirtazapine  7.5 mg Oral QHS  . multivitamin with minerals  1 tablet Oral Daily  . tranexamic acid (CYKLOKAPRON) topical - INTRAOP  2,000 mg Topical Once  . zinc sulfate  220 mg Oral Daily   Continuous Infusions: . sodium chloride 75 mL/hr at 01/15/20 2135  . methocarbamol (ROBAXIN) IV       LOS: 2 days    Donnamae Jude, MD 01/16/2020 5:00 PM [phone] [facility] If 7PM-7AM, please contact night-coverage  01/16/2020, 5:00 PM

## 2020-01-16 NOTE — Plan of Care (Signed)

## 2020-01-17 ENCOUNTER — Inpatient Hospital Stay (HOSPITAL_COMMUNITY): Payer: Medicare Other

## 2020-01-17 ENCOUNTER — Encounter (HOSPITAL_COMMUNITY): Payer: Self-pay | Admitting: Orthopaedic Surgery

## 2020-01-17 LAB — BASIC METABOLIC PANEL
Anion gap: 8 (ref 5–15)
BUN: 28 mg/dL — ABNORMAL HIGH (ref 8–23)
CO2: 28 mmol/L (ref 22–32)
Calcium: 8.2 mg/dL — ABNORMAL LOW (ref 8.9–10.3)
Chloride: 103 mmol/L (ref 98–111)
Creatinine, Ser: 0.87 mg/dL (ref 0.44–1.00)
GFR calc Af Amer: >60 mL/min (ref >60)
GFR calc non Af Amer: >60 mL/min (ref >60)
Glucose, Bld: 117 mg/dL — ABNORMAL HIGH (ref 70–99)
Potassium: 3.5 mmol/L (ref 3.5–5.1)
Sodium: 139 mmol/L (ref 135–145)

## 2020-01-17 LAB — CBC
HCT: 22.3 % — ABNORMAL LOW (ref 36.0–46.0)
Hemoglobin: 7.1 g/dL — ABNORMAL LOW (ref 12.0–15.0)
MCH: 29.5 pg (ref 26.0–34.0)
MCHC: 31.8 g/dL (ref 30.0–36.0)
MCV: 92.5 fL (ref 80.0–100.0)
Platelets: 89 10*3/uL — ABNORMAL LOW (ref 150–400)
RBC: 2.41 MIL/uL — ABNORMAL LOW (ref 3.87–5.11)
RDW: 12.8 % (ref 11.5–15.5)
WBC: 7.2 10*3/uL (ref 4.0–10.5)
nRBC: 0 % (ref 0.0–0.2)

## 2020-01-17 NOTE — TOC Initial Note (Addendum)
Transition of Care [facility]) - Initial/Assessment Note    Patient Details  Name: Janet [name] MRN: [id number] Date of Birth: July 04, 1935  [facility]) CM/SW Contact:    Jacquelynn Cree Phone Number: 01/17/2020, 1:57 PM  Clinical Narrative:                  CSW contacted patient's stated HCPOA Kaylyn Layer to discuss PT recommendation for SNF placement.Marland Kitchen HCPOA stated she is not patient's biological daughter but has known her for over 25 years after working together. She stated patient has a brother-in-law Mr. Marcello Moores who lives in [place] and two sisters she doesn't have contact with do to having Alzheimer's and not knowing one another. HCPOA Manuela Schwartz expressed patient has no contact with her daughter.  Prior to arrival, patient living at [facility] and independent. HCPOA expressed understanding of PT recommendation for short term SNF placement and is in agreement. Patient has never been to a SNF and the preference is [facility] at this time. TOC team will continue to follow and assist with discharge needs.  Expected Discharge Plan: Skilled Nursing Facility Barriers to Discharge: Continued Medical Work up, Ship broker   Patient Goals and CMS Choice   CMS Medicare.gov Compare Post Acute Care list provided to:: Patient Represenative (must comment) Kaylyn Layer) Choice offered to / list presented to : [facility] S[facility] / Guardian  Expected Discharge Plan and Services Expected Discharge Plan: [place] In-house Referral: Clinical Social Work     Living arrangements for the past 2 months: [place] ([place])                                      Prior Living Arrangements/Services Living arrangements for the past 2 months: [place] ([facility]) Lives with:: Facility Resident Patient language and need for interpreter reviewed:: Yes Do you feel safe going back to the place where you live?: Yes       Need for Family Participation in Patient Care: Yes (Comment) Care giver support system in place?: Yes (comment)   Criminal Activity/Legal Involvement Pertinent to Current Situation/Hospitalization: No - Comment as needed  Activities of Daily Living Home Assistive Devices/Equipment: Gilford Rile (specify type) ADL Screening (condition at time of admission) Patient's cognitive ability adequate to safely complete daily activities?: No Is the patient deaf or have difficulty hearing?: No Does the patient have difficulty seeing, even when wearing glasses/contacts?: No Does the patient have difficulty concentrating, remembering, or making decisions?: Yes Patient able to express need for assistance with ADLs?: Yes Does the patient have difficulty dressing or bathing?: Yes Independently performs ADLs?: No Communication: Independent Dressing (OT): Needs assistance Is this a change from baseline?: Pre-admission baseline Grooming: Needs assistance Is this a change from baseline?: Pre-admission baseline Feeding: Independent Bathing: Needs assistance Is this a change from baseline?: Pre-admission baseline Toileting: Needs assistance Is this a change from baseline?: Pre-admission baseline In/Out Bed: Needs assistance Is this a change from baseline?: Pre-admission baseline Walks in Home: Needs assistance Is this a change from baseline?: Pre-admission baseline Does the patient have difficulty walking or climbing stairs?: Yes Weakness of Legs: Both Weakness of Arms/Hands: None  Permission Sought/Granted Permission sought to share information with : Family Supports, Customer service manager    Share Information with NAME: Kaylyn Layer  Permission granted to share info w AGENCY: SNFs  Permission granted to share info w  Relationship: HCPOA  Permission granted to share info w Contact Information: [phone]  Emotional Assessment   Attitude/Demeanor/Rapport: Unable to Assess Affect  (typically observed): Unable to Assess Orientation: : Oriented to Situation Alcohol / Substance Use: Not Applicable Psych Involvement: No (comment)  Admission diagnosis:  Thyroid nodule [E04.1] Hip fracture (HCC) [S72.009A] Closed fracture of right hip, initial encounter [facility]) [S72.001A] Patient Active Problem List   Diagnosis Date Noted  . Closed displaced fracture of right femoral neck ([place]) 01/14/2020  . Thyroid nodule 01/14/2020  . Thrombocytopenia ([place]) 01/14/2020  . Dementia ([place]) 01/14/2020  . TIA (transient ischemic attack)   . High cholesterol   . Neuralgia facialis vera    PCP:  Prince Solian, MD Pharmacy:   [id number] - [place], [place] [address] [place] [address] Phone: [phone] Fax: [phone]     Social Determinants of Health (SDOH) Interventions    Readmission Risk Interventions No flowsheet data found.

## 2020-01-17 NOTE — Progress Notes (Signed)
° °  Subjective:  Patient is sleeping.  Objective:   VITALS:   Vitals:   01/16/20 0800 01/16/20 1950 01/17/20 0300 01/17/20 0751  BP: 130/80 136/88 137/88 125/60  Pulse:  71 61 99  Resp: 16 17 18 16   Temp: 98.4 F (36.9 C) 98.7 F (37.1 C) 98.8 F (37.1 C) 98.9 F (37.2 C)  TempSrc: Axillary Axillary Axillary Axillary  SpO2: 98% 96% 96% 93%    Intact pulses distally Dorsiflexion/Plantar flexion intact Incision: dressing C/D/I and no drainage   Lab Results  Component Value Date   WBC 7.2 01/17/2020   HGB 7.1 (L) 01/17/2020   HCT 22.3 (L) 01/17/2020   MCV 92.5 01/17/2020   PLT 89 (L) 01/17/2020     Assessment/Plan:  2 Days Post-Op   - Expected postop acute blood loss anemia - will monitor for symptoms - consider RBC transfusion if symptomatic - Up with PT/OT - DVT ppx - SCDs, ambulation, lovenox - WBAT operative extremity   Eduard Roux 01/17/2020, 9:37 AM [phone]

## 2020-01-17 NOTE — Plan of Care (Signed)
  Problem: Health Behavior/Discharge Planning: Goal: Ability to manage health-related needs will improve Outcome: Progressing   Problem: Pain Managment: Goal: General experience of comfort will improve Outcome: Progressing   Problem: Safety: Goal: Ability to remain free from injury will improve Outcome: Progressing   

## 2020-01-17 NOTE — [place] FL2 (Signed)
[place] MEDICAID FL2 LEVEL OF CARE SCREENING TOOL     IDENTIFICATION  Patient Name: Janet [name] Birthdate: 1935-02-09 Sex: female Admission Date (Current Location): 01/14/2020  [facility] and [place] Number:  Herbalist and Address:  [facility] [place] [address] [place], [place] [address]      Provider Number: [id number]  Attending Physician Name and Address:  Donnamae Jude, MD  Relative Name and Phone Number:  Kaylyn Layer    Current Level of Care: Hospital Recommended Level of Care: [place] Prior Approval Number:    Date Approved/Denied:   PASRR Number: [id number]  Discharge Plan: SNF    Current Diagnoses: Patient Active Problem List   Diagnosis Date Noted  . Closed displaced fracture of right femoral neck ([place]) 01/14/2020  . Thyroid nodule 01/14/2020  . Thrombocytopenia ([place]) 01/14/2020  . Dementia ([place]) 01/14/2020  . TIA (transient ischemic attack)   . High cholesterol   . Neuralgia facialis vera     Orientation RESPIRATION BLADDER Height & Weight     Self  Normal Incontinent, External catheter Weight:   Height:     BEHAVIORAL SYMPTOMS/MOOD NEUROLOGICAL BOWEL NUTRITION STATUS      Continent Diet (See dc summary)  AMBULATORY STATUS COMMUNICATION OF NEEDS Skin   Total Care Verbally Surgical wounds (Right hip closed incision)                       Personal Care Assistance Level of Assistance  Bathing, Feeding, Dressing Bathing Assistance: Maximum assistance Feeding assistance: Maximum assistance Dressing Assistance: Maximum assistance     Functional Limitations Info  Sight, Speech, Hearing Sight Info: Adequate Hearing Info: Adequate Speech Info: Adequate    SPECIAL CARE FACTORS FREQUENCY  PT (By licensed PT), OT (By licensed OT)     PT Frequency: 5x a week OT Frequency: 5x a week            Contractures Contractures Info: Not present    Additional Factors Info  Code Status,  Allergies Code Status Info: DNR Allergies Info: Aspirin, Penicillins, Sulfa Antibiotics           Current Medications (01/17/2020):  This is the current hospital active medication list Current Facility-Administered Medications  Medication Dose Route Frequency Provider Last Rate Last Admin  . 0.9 %  sodium chloride infusion   Intravenous Continuous Leandrew Koyanagi, MD 75 mL/hr at 01/15/20 2135 New Bag at 01/15/20 2135  . acetaminophen (TYLENOL) tablet 325-650 mg  325-650 mg Oral Q6H PRN Leandrew Koyanagi, MD      . alum & mag hydroxide-simeth (MAALOX/MYLANTA) 200-200-20 MG/5ML suspension 30 mL  30 mL Oral Q4H PRN Leandrew Koyanagi, MD      . atorvastatin (LIPITOR) tablet 40 mg  40 mg Oral QPM Leandrew Koyanagi, MD   40 mg at 01/16/20 2030  . cholecalciferol (VITAMIN D3) tablet 1,000 Units  1,000 Units Oral Daily Leandrew Koyanagi, MD   1,000 Units at 01/17/20 1020  . darifenacin (ENABLEX) 24 hr tablet 7.5 mg  7.5 mg Oral Daily Leandrew Koyanagi, MD   7.5 mg at 01/17/20 1020  . divalproex (DEPAKOTE) DR tablet 250 mg  250 mg Oral BID Leandrew Koyanagi, MD   250 mg at 01/17/20 1020  . docusate sodium (COLACE) capsule 100 mg  100 mg Oral BID Leandrew Koyanagi, MD   100 mg at 01/17/20 1020  . donepezil (ARICEPT) tablet 10 mg  10 mg Oral BID Leandrew Koyanagi, MD   10 mg at 01/17/20 1020  . enoxaparin (LOVENOX) injection 40 mg  40 mg Subcutaneous Q24H Leandrew Koyanagi, MD   40 mg at 01/17/20 0915  . escitalopram (LEXAPRO) tablet 10 mg  10 mg Oral Daily Leandrew Koyanagi, MD   10 mg at 01/17/20 1020  . feeding supplement (ENSURE ENLIVE) (ENSURE ENLIVE) liquid 237 mL  237 mL Oral BID BM Gonfa, Taye T, MD   237 mL at 01/16/20 1438  . HYDROcodone-acetaminophen (NORCO) 7.5-325 MG per tablet 1-2 tablet  1-2 tablet Oral Q4H PRN Leandrew Koyanagi, MD   1 tablet at 01/16/20 2043  . HYDROcodone-acetaminophen (NORCO/VICODIN) 5-325 MG per tablet 1-2 tablet  1-2 tablet Oral Q4H PRN Leandrew Koyanagi, MD   2 tablet at 01/17/20 1103  . magnesium citrate solution  1 Bottle  1 Bottle Oral Once PRN Leandrew Koyanagi, MD      . magnesium hydroxide (MILK OF MAGNESIA) suspension 30 mL  30 mL Oral QHS PRN Leandrew Koyanagi, MD      . menthol-cetylpyridinium (CEPACOL) lozenge 3 mg  1 lozenge Oral PRN Leandrew Koyanagi, MD       Or  . phenol (CHLORASEPTIC) mouth spray 1 spray  1 spray Mouth/Throat PRN Leandrew Koyanagi, MD      . methocarbamol (ROBAXIN) tablet 500 mg  500 mg Oral Q6H PRN Leandrew Koyanagi, MD   500 mg at 01/17/20 1020   Or  . methocarbamol (ROBAXIN) 500 mg in dextrose 5 % 50 mL IVPB  500 mg Intravenous Q6H PRN Leandrew Koyanagi, MD      . metoprolol succinate (TOPROL-XL) 24 hr tablet 25 mg  25 mg Oral Daily Leandrew Koyanagi, MD   25 mg at 01/17/20 1020  . mirtazapine (REMERON) tablet 7.5 mg  7.5 mg Oral QHS Leandrew Koyanagi, MD   7.5 mg at 01/16/20 2044  . morphine 2 MG/ML injection 1 mg  1 mg Intravenous Q2H PRN Leandrew Koyanagi, MD   1 mg at 01/16/20 1840  . multivitamin with minerals tablet 1 tablet  1 tablet Oral Daily Leandrew Koyanagi, MD   1 tablet at 01/17/20 1020  . ondansetron (ZOFRAN) tablet 4 mg  4 mg Oral Q6H PRN Leandrew Koyanagi, MD       Or  . ondansetron [facility]) injection 4 mg  4 mg Intravenous Q6H PRN Leandrew Koyanagi, MD      . polyethylene glycol (MIRALAX / GLYCOLAX) packet 17 g  17 g Oral Daily PRN Leandrew Koyanagi, MD      . sorbitol 70 % solution 30 mL  30 mL Oral Daily PRN Leandrew Koyanagi, MD      . tranexamic acid (CYKLOKAPRON) 2,000 mg in sodium chloride 0.9 % 50 mL Topical Application  4,742 mg Topical Once Wendee Beavers T, MD      . zinc sulfate capsule 220 mg  220 mg Oral Daily Leandrew Koyanagi, MD   220 mg at 01/17/20 1020     Discharge Medications: Please see discharge summary for a list of discharge medications.  Relevant Imaging Results:  Relevant Lab Results:   Additional Information SSN [id number]  Neysa Hotter [place], [place]

## 2020-01-17 NOTE — Progress Notes (Signed)
Patient ID: Janet [name], female   DOB: 01-28-1935, 85 y.o.   MRN: [id number]  PROGRESS NOTE    Janet [name]  VZD:[id number] DOB: 07-12-1935 DOA: 01/14/2020 PCP: Prince Solian, MD    Brief Narrative:  85 year old female with severe dementia, TIA and hyperlipidemia [facility] for right hip pain after witnessed mechanical fall.She was [facility] intervention.  Patient underwentprosthetic replacement for femoral neck fractureby Dr.Xuon 01/15/2020.  Assessment & Plan:   Principal Problem:   Closed displaced fracture of right femoral neck (HCC) Active Problems:   High cholesterol   Thyroid nodule   Thrombocytopenia (HCC)   Dementia (HCC)  "Witnessed mechanical"fall at nursing home-multiple potential causes including severe dementia and meds Comminuted impacted displaced closed right femoral neck fracture -s/pprosthetic replacement for femoral neck fractureby Dr. Erlinda Hong on6/19/1921 -Pain control, activity and VTE prophylaxis per Ortho recommendations  Severe dementia without behavioral disturbance -Continue home medications -Reorientation, delirium and fall precautions -Not rebounding as well as we would hope  Chronic thrombocytopenia: At baseline -Monitor as appropriate  Thyroid nodule: 17 mm hypodense nodule in the left lobe of thyroid gland noted.Unclear significance of this. -Check TSH -Follow thyroid ultrasound-ordered on admission.  Hyperlipidemia-on Lipitor 40 mg daily.Unclear indication. Not diabetic. No CVA vaccination of TIA discharge. -Recommend discontinuing  Mood disorder: On Depakote, Lexapro, Remeron at home -Continue home meds  Urinary incontinence -Continue home Vesicare  At risk for polypharmacy: On significant amount of sedating medication which would increase her risk of fall -Recommend simplified regimen with essential meds on discharge.  DVT prophylaxis: Lovenox SQ Code  Status: DNR  Family Communication: Daughter on the phone Disposition Plan: SNF  Consultants:   Ortho  Procedures:  Prosthetic replacement of femoral neck fracture   Antimicrobials: Anti-infectives (From admission, onward)   Start     Dose/Rate Route Frequency Ordered Stop   01/15/20 1500  ceFAZolin (ANCEF) IVPB 2g/100 mL premix        2 g 200 mL/hr over 30 Minutes Intravenous Every 6 hours 01/15/20 1156 01/16/20 1036   01/15/20 1200  ceFAZolin (ANCEF) IVPB 2g/100 mL premix  Status:  Discontinued        2 g 200 mL/hr over 30 Minutes Intravenous On call to O.R. 01/15/20 1155 01/15/20 1222   01/15/20 1008  vancomycin (VANCOCIN) powder  Status:  Discontinued          As needed 01/15/20 1008 01/15/20 1008   01/15/20 0802  ceFAZolin (ANCEF) 2-4 GM/100ML-% IVPB       Note to Pharmacy: Henrine Screws   : cabinet override      01/15/20 0802 01/15/20 2014   01/15/20 0800  ceFAZolin (ANCEF) IVPB 2g/100 mL premix  Status:  Discontinued        2 g 200 mL/hr over 30 Minutes Intravenous  Once 01/15/20 0759 01/15/20 1045     Subjective: Patient sleeping, she is not eating very well.  Objective: Vitals:   01/16/20 1950 01/17/20 0300 01/17/20 0751 01/17/20 1417  BP: 136/88 137/88 125/60 122/62  Pulse: 71 61 99 97  Resp: 17 18 16 16   Temp: 98.7 F (37.1 C) 98.8 F (37.1 C) 98.9 F (37.2 C) 98.7 F (37.1 C)  TempSrc: Axillary Axillary Axillary Axillary  SpO2: 96% 96% 93% 95%    Intake/Output Summary (Last 24 hours) at 01/17/2020 1736 Last data filed at 01/17/2020 1330 Gross per 24 hour  Intake 823.79 ml  Output 300 ml  Net 523.79 ml   There  were no vitals filed for this visit.  Examination: General exam: Appears calm and comfortable  Respiratory system: Clear to auscultation. Respiratory effort normal. Cardiovascular system: S1 & S2 heard, RRR.  Gastrointestinal system: Abdomen is nondistended, soft and nontender.  Central nervous system: Alert and oriented. No focal  neurological deficits. Extremities: Symmetric  Skin: No rashes Psychiatry: Judgement and insight appear normal. Mood & affect appropriate.   Data Reviewed: I have personally reviewed following labs and imaging studies  CBC: Recent Labs  Lab 01/14/20 2149 01/15/20 1201 01/16/20 0745 01/17/20 0335  WBC 10.4 10.1 10.0 7.2  NEUTROABS 7.7  --   --   --   HGB 11.5* 10.3* 8.2* 7.1*  HCT 35.1* 31.6* 25.1* 22.3*  MCV 90.7 91.1 91.3 92.5  PLT 123* 112* 100* 89*   Basic Metabolic Panel: Recent Labs  Lab 01/14/20 2149 01/15/20 1201 01/16/20 0745 01/17/20 0335  NA 142  --  140 139  K 3.5  --  3.6 3.5  CL 104  --  105 103  CO2 27  --  24 28  GLUCOSE 144*  --  141* 117*  BUN 28*  --  27* 28*  CREATININE 0.90 0.93 0.87 0.87  CALCIUM 8.8*  --  8.1* 8.2*   GFR: CrCl cannot be calculated (Unknown ideal weight.).  Coagulation Profile: Recent Labs  Lab 01/14/20 2149  INR 1.0    Recent Results (from the past 240 hour(s))  SARS Coronavirus 2 by RT PCR (hospital order, performed in [facility] hospital lab) Nasopharyngeal Nasopharyngeal Swab     Status: None   Collection Time: 01/14/20 10:33 PM   Specimen: Nasopharyngeal Swab  Result Value Ref Range Status   SARS Coronavirus 2 NEGATIVE NEGATIVE Final    Comment: (NOTE) SARS-CoV-2 target nucleic acids are NOT DETECTED.  The SARS-CoV-2 RNA is generally detectable in upper and lower respiratory specimens during the acute phase of infection. The lowest concentration of SARS-CoV-2 viral copies this assay can detect is 250 copies / mL. A negative result does not preclude SARS-CoV-2 infection and should not be used as the sole basis for treatment or other patient management decisions.  A negative result may occur with improper specimen collection / handling, submission of specimen other than nasopharyngeal swab, presence of viral mutation(s) within the areas targeted by this assay, and inadequate number of viral copies (<250 copies  / mL). A negative result must be combined with clinical observations, patient history, and epidemiological information.  Fact Sheet for Patients:   StrictlyIdeas.no  Fact Sheet for Healthcare Providers: BankingDealers.co.za  This test is not yet approved or  cleared by the [place] FDA and has been authorized for detection and/or diagnosis of SARS-CoV-2 by FDA under an Emergency Use Authorization (EUA).  This EUA will remain in effect (meaning this test can be used) for the duration of the COVID-19 declaration under Section 564(b)(1) of the Act, 21 U.S.C. section 360bbb-3(b)(1), unless the authorization is terminated or revoked sooner.  Performed at [facility], [place] [address], [place], [place] [address]       Radiology Studies: No results found.   Scheduled Meds: . atorvastatin  40 mg Oral QPM  . cholecalciferol  1,000 Units Oral Daily  . darifenacin  7.5 mg Oral Daily  . divalproex  250 mg Oral BID  . docusate sodium  100 mg Oral BID  . donepezil  10 mg Oral BID  . enoxaparin (LOVENOX) injection  40 mg Subcutaneous Q24H  . escitalopram  10 mg Oral Daily  . feeding supplement (ENSURE ENLIVE)  237 mL Oral BID BM  . metoprolol succinate  25 mg Oral Daily  . mirtazapine  7.5 mg Oral QHS  . multivitamin with minerals  1 tablet Oral Daily  . tranexamic acid (CYKLOKAPRON) topical - INTRAOP  2,000 mg Topical Once  . zinc sulfate  220 mg Oral Daily   Continuous Infusions: . sodium chloride 75 mL/hr at 01/15/20 2135  . methocarbamol (ROBAXIN) IV       LOS: 3 days    Donnamae Jude, MD 01/17/2020 5:36 PM [phone] [facility] If 7PM-7AM, please contact night-coverage 01/17/2020, 5:36 PM

## 2020-01-18 NOTE — Progress Notes (Signed)
Nutrition Follow-up  DOCUMENTATION CODES:   Not applicable  INTERVENTION:   -Continue Ensure Enlive po BID, each supplement provides 350 kcal and 20 grams of protein -Magic cup TID with meals, each supplement provides 290 kcal and 9 grams of protein -MVI with minerals daily -Downgrade diet to dysphagia 2 (mehanical soft) diet for ease of intake  NUTRITION DIAGNOSIS:   Increased nutrient needs related to post-op healing as evidenced by estimated needs.  Ongoing  GOAL:   Patient will meet greater than or equal to 90% of their needs  Unmet  MONITOR:   Labs, Supplement acceptance, Weight trends, PO intake, Skin  REASON FOR ASSESSMENT:   Malnutrition Screening Tool    ASSESSMENT:   85 year old female with past medical history significant of dementia, HLD, Neuralgia facialis vera, and history of TIA presented from nursing facility after witnessed mechanical fall. Patient transferred to [facility] for surgical management of right femoral neck fracture.  6/19- s/p Prosthetic replacement for femoral neck fracture  Reviewed I/O's: -450 ml x 24 hours and +1.5 L since admission  UOP: 75 ml x 24 hours  Pt resting quietly at time of visit. Pt drowsy, but answered some questions. Pt unable to provide accurate hisotry, due to dementia.  Noted pt consumed only a few sips of Ensure supplement this AM and tray is untouched. Observed dentures on tray table. Pt denies any difficulty chewing or swallowing food, however, unsure if this is accurate. Noted meal completion 0-10%.   Pt currently on a soft diet, which is a surgical soft (low fiber) diet designed for patients with GI diseases/ flare-ups as well as pts who have recently undergone GI surgery. Pt would better benefit from a mechanically altered (ex dysphagia 2) diet for ease of intake.   Per [facility] team notes, plan for SNF placement once medically stable.  Labs reviewed.   NUTRITION - FOCUSED PHYSICAL EXAM:    Most Recent Value   Orbital Region No depletion  Upper Arm Region Mild depletion  Thoracic and Lumbar Region No depletion  Buccal Region No depletion  Temple Region Mild depletion  Clavicle Bone Region Mild depletion  Clavicle and Acromion Bone Region No depletion  Scapular Bone Region No depletion  Dorsal Hand Mild depletion  Patellar Region No depletion  Anterior Thigh Region No depletion  Posterior Calf Region No depletion  Edema (RD Assessment) Mild  Hair Reviewed  Eyes Reviewed  Mouth Reviewed  Skin Reviewed  Nails Reviewed       Diet Order:   Diet Order            DIET SOFT Room service appropriate? Yes; Fluid consistency: Thin  Diet effective now                 EDUCATION NEEDS:   No education needs have been identified at this time  Skin:  Skin Assessment: Skin Integrity Issues: Skin Integrity Issues:: Incisions Incisions: closed; R hip  Last BM:  01/14/20  Height:   Ht Readings from Last 1 Encounters:  01/18/20 5\' 1"  (1.549 m)    Weight:   Wt Readings from Last 1 Encounters:  01/18/20 60.2 kg    Ideal Body Weight:  47.7 kg  BMI:  Body mass index is 25.08 kg/m.  Estimated Nutritional Needs:   Kcal:  1500-1700  Protein:  75-85  Fluid:  >/= 1.4 L/day    Loistine Chance, RD, LDN, CDCES Registered Dietitian II Certified Diabetes Care and Education Specialist Please refer to [facility] for  RD and/or RD on-call/weekend/after hours pager

## 2020-01-18 NOTE — Progress Notes (Signed)
Patient ID: Janet [name], female   DOB: 09-Aug-1934, 85 y.o.   MRN: [id number]  PROGRESS NOTE    Brendaliz Kuk  BMW:[id number] DOB: 20-Nov-1934 DOA: 01/14/2020 PCP: Prince Solian, MD    Brief Narrative:  85 year old female with severe dementia, TIA and hyperlipidemia [facility] for right hip pain after witnessed mechanical fall.She was transferred to [facility] for surgical intervention.  Patient underwentprosthetic replacement for femoral neck fractureby Dr.Xuon 01/15/2020.   Assessment & Plan:   Principal Problem:   Closed displaced fracture of right femoral neck (HCC) Active Problems:   High cholesterol   Thyroid nodule   Thrombocytopenia (HCC)   Dementia (HCC)  "Witnessed mechanical"fall at nursing home-multiple potential causes including severe dementia and meds Comminuted impacted displaced closed right femoral neck fracture -s/pprosthetic replacement for femoral neck fractureby Dr. Erlinda Hong on6/19/1921 -Pain control, activity and VTE prophylaxis per Ortho recommendations-- Have tried to hold her pain meds and muscle relaxers in hopes she will awaken and eat more and participate in therapies.  Severe dementiawithout behavioral disturbance -Continue home medications -Reorientation, delirium and fall precautions -Not rebounding as well as we would hope  Chronic thrombocytopenia:At baseline -Monitor as appropriate  Thyroid nodule: 17 mm hypodense nodule in the left lobe of thyroid gland noted.Unclear significance of this. -Check TSH -Follow thyroid ultrasound-ordered on admission.--meets criteria for biopsy--can be done if more awake  Hyperlipidemia-on Lipitor 40 mg daily.Unclear indication. Not diabetic. No CVA vaccination of TIA discharge. -Recommend discontinuing  Mood disorder: On Depakote, Lexapro, Remeron at home -Continue home meds  Urinary incontinence -Continue home Vesicare  At risk for polypharmacy:On  significant amount of sedating medication which would increase her risk of fall -Recommend simplified regimen with essential meds on discharge.  DVT prophylaxis: Lovenox SQ Code Status: DNR  Family Communication:  Disposition Plan: SNF  Consultants:   Ortho  Procedures:  Prosthetic replacement of femoral neck fracture  Antimicrobials: Anti-infectives (From admission, onward)   Start     Dose/Rate Route Frequency Ordered Stop   01/15/20 1500  ceFAZolin (ANCEF) IVPB 2g/100 mL premix        2 g 200 mL/hr over 30 Minutes Intravenous Every 6 hours 01/15/20 1156 01/16/20 1036   01/15/20 1200  ceFAZolin (ANCEF) IVPB 2g/100 mL premix  Status:  Discontinued        2 g 200 mL/hr over 30 Minutes Intravenous On call to O.R. 01/15/20 1155 01/15/20 1222   01/15/20 1008  vancomycin (VANCOCIN) powder  Status:  Discontinued          As needed 01/15/20 1008 01/15/20 1008   01/15/20 0802  ceFAZolin (ANCEF) 2-4 GM/100ML-% IVPB       Note to Pharmacy: Henrine Screws   : cabinet override      01/15/20 0802 01/15/20 2014   01/15/20 0800  ceFAZolin (ANCEF) IVPB 2g/100 mL premix  Status:  Discontinued        2 g 200 mL/hr over 30 Minutes Intravenous  Once 01/15/20 0759 01/15/20 1045       Subjective: Feels thirsty today  Objective: Vitals:   01/17/20 2338 01/18/20 0413 01/18/20 0800 01/18/20 1054  BP: (!) 146/71 (!) 125/59 (!) 148/69   Pulse: 99 79 91   Resp:  18 12   Temp:  97.9 F (36.6 C) 99.4 F (37.4 C)   TempSrc:  Oral Oral   SpO2:  94% 95%   Weight:    60.2 kg  Height:    5\' 1"  (1.549 m)  Intake/Output Summary (Last 24 hours) at 01/18/2020 1153 Last data filed at 01/18/2020 1100 Gross per 24 hour  Intake 60 ml  Output 750 ml  Net -690 ml   Filed Weights   01/18/20 1054  Weight: 60.2 kg    Examination:  General exam: Appears calm and comfortable  Respiratory system: Clear to auscultation. Respiratory effort normal. Cardiovascular system: S1 & S2 heard, RRR.    Gastrointestinal system: Abdomen is nondistended, soft and nontender.  Central nervous system: sleepy and confused Extremities: Symmetric  Skin: No rashes .   Data Reviewed: I have personally reviewed following labs and imaging studies  CBC: Recent Labs  Lab 01/14/20 2149 01/15/20 1201 01/16/20 0745 01/17/20 0335  WBC 10.4 10.1 10.0 7.2  NEUTROABS 7.7  --   --   --   HGB 11.5* 10.3* 8.2* 7.1*  HCT 35.1* 31.6* 25.1* 22.3*  MCV 90.7 91.1 91.3 92.5  PLT 123* 112* 100* 89*   Basic Metabolic Panel: Recent Labs  Lab 01/14/20 2149 01/15/20 1201 01/16/20 0745 01/17/20 0335  NA 142  --  140 139  K 3.5  --  3.6 3.5  CL 104  --  105 103  CO2 27  --  24 28  GLUCOSE 144*  --  141* 117*  BUN 28*  --  27* 28*  CREATININE 0.90 0.93 0.87 0.87  CALCIUM 8.8*  --  8.1* 8.2*   GFR: Estimated Creatinine Clearance: 41.6 mL/min (by C-G formula based on SCr of 0.87 mg/dL). Liver Function Tests: No results for input(s): AST, ALT, ALKPHOS, BILITOT, PROT, ALBUMIN in the last 168 hours. No results for input(s): LIPASE, AMYLASE in the last 168 hours. No results for input(s): AMMONIA in the last 168 hours. Coagulation Profile: Recent Labs  Lab 01/14/20 2149  INR 1.0   Cardiac Enzymes: No results for input(s): CKTOTAL, CKMB, CKMBINDEX, TROPONINI in the last 168 hours. BNP (last 3 results) No results for input(s): PROBNP in the last 8760 hours. HbA1C: No results for input(s): HGBA1C in the last 72 hours. CBG: No results for input(s): GLUCAP in the last 168 hours. Lipid Profile: No results for input(s): CHOL, HDL, LDLCALC, TRIG, CHOLHDL, LDLDIRECT in the last 72 hours. Thyroid Function Tests: No results for input(s): TSH, T4TOTAL, FREET4, T3FREE, THYROIDAB in the last 72 hours. Anemia Panel: No results for input(s): VITAMINB12, FOLATE, FERRITIN, TIBC, IRON, RETICCTPCT in the last 72 hours. Sepsis Labs: No results for input(s): PROCALCITON, LATICACIDVEN in the last 168  hours.  Recent Results (from the past 240 hour(s))  SARS Coronavirus 2 by RT PCR (hospital order, performed in [facility] hospital lab) Nasopharyngeal Nasopharyngeal Swab     Status: None   Collection Time: 01/14/20 10:33 PM   Specimen: Nasopharyngeal Swab  Result Value Ref Range Status   SARS Coronavirus 2 NEGATIVE NEGATIVE Final    Comment: (NOTE) SARS-CoV-2 target nucleic acids are NOT DETECTED.  The SARS-CoV-2 RNA is generally detectable in upper and lower respiratory specimens during the acute phase of infection. The lowest concentration of SARS-CoV-2 viral copies this assay can detect is 250 copies / mL. A negative result does not preclude SARS-CoV-2 infection and should not be used as the sole basis for treatment or other patient management decisions.  A negative result may occur with improper specimen collection / handling, submission of specimen other than nasopharyngeal swab, presence of viral mutation(s) within the areas targeted by this assay, and inadequate number of viral copies (<250 copies / mL). A negative result must  be combined with clinical observations, patient history, and epidemiological information.  Fact Sheet for Patients:   StrictlyIdeas.no  Fact Sheet for Healthcare Providers: BankingDealers.co.za  This test is not yet approved or  cleared by the [place] FDA and has been authorized for detection and/or diagnosis of SARS-CoV-2 by FDA under an Emergency Use Authorization (EUA).  This EUA will remain in effect (meaning this test can be used) for the duration of the COVID-19 declaration under Section 564(b)(1) of the Act, 21 U.S.C. section 360bbb-3(b)(1), unless the authorization is terminated or revoked sooner.  Performed at [facility], [place] [address], [place], [place] [address]       Radiology Studies: US THYROID  Result Date: 01/18/2020 CLINICAL DATA:  Left thyroid  nodule. Thyroid nodule noted on cervical spine CT. EXAM: THYROID ULTRASOUND TECHNIQUE: Ultrasound examination of the thyroid gland and adjacent soft tissues was performed. COMPARISON:  Cervical spine CT 01/13/2017 FINDINGS: Parenchymal Echotexture: Mildly heterogenous Isthmus: 0.3 cm Right lobe: 2.7 x 1.0 x 1.4 cm Left lobe: 3.3 x 1.7 x 1.8 cm _________________________________________________________ Estimated total number of nodules >/= 1 cm: 1 Number of spongiform nodules >/=  2 cm not described below (TR1): 0 Number of mixed cystic and solid nodules >/= 1.5 cm not described below (TR2): 0 _________________________________________________________ Nodule # 1: Location: Left; Mid Maximum size: 2.0 cm; Other 2 dimensions: 1.3 x 1.5 cm Composition: solid/almost completely solid (2) Echogenicity: hypoechoic (2) Shape: not taller-than-wide (0) Margins: ill-defined (0) Echogenic foci: none (0) ACR TI-RADS total points: 4. ACR TI-RADS risk category: TR4 (4-6 points). ACR TI-RADS recommendations: **Given size (>/= 1.5 cm) and appearance, fine needle aspiration of this moderately suspicious nodule should be considered based on TI-RADS criteria. _________________________________________________________ No discrete right thyroid nodule. IMPRESSION: Left thyroid nodule measures up to 2.0 cm and compatible with a TR 4 nodule. This nodule meets criteria for ultrasound-guided biopsy. The above is in keeping with the ACR TI-RADS recommendations - J Am Coll Radiol 2017;14:587-595. Electronically Signed   By: Markus Daft M.D.   On: 01/18/2020 08:14     Scheduled Meds: . atorvastatin  40 mg Oral QPM  . cholecalciferol  1,000 Units Oral Daily  . darifenacin  7.5 mg Oral Daily  . divalproex  250 mg Oral BID  . docusate sodium  100 mg Oral BID  . donepezil  10 mg Oral BID  . enoxaparin (LOVENOX) injection  40 mg Subcutaneous Q24H  . escitalopram  10 mg Oral Daily  . feeding supplement (ENSURE ENLIVE)  237 mL Oral BID BM  .  metoprolol succinate  25 mg Oral Daily  . mirtazapine  7.5 mg Oral QHS  . multivitamin with minerals  1 tablet Oral Daily  . tranexamic acid (CYKLOKAPRON) topical - INTRAOP  2,000 mg Topical Once  . zinc sulfate  220 mg Oral Daily   Continuous Infusions: . sodium chloride 75 mL/hr at 01/15/20 2135  . methocarbamol (ROBAXIN) IV       LOS: 4 days    Donnamae Jude, MD 01/18/2020 11:53 AM [phone] [facility] If 7PM-7AM, please contact night-coverage 01/18/2020, 11:53 AM

## 2020-01-18 NOTE — Plan of Care (Addendum)
Unaware  2024 BP 162/69 Retake @2338  BP 146/71 Pt stable, will continue to monitor    Problem: Education: Goal: Knowledge of General Education information will improve Description: Including pain rating scale, medication(s)/side effects and non-pharmacologic comfort measures Outcome: Progressing   Problem: Activity: Goal: Risk for activity intolerance will decrease Outcome: Progressing   Problem: Pain Managment: Goal: General experience of comfort will improve Outcome: Progressing   Problem: Safety: Goal: Ability to remain free from injury will improve Outcome: Progressing   Problem: Skin Integrity: Goal: Risk for impaired skin integrity will decrease Outcome: Progressing

## 2020-01-18 NOTE — Progress Notes (Signed)
Physical Therapy Treatment Patient Details Name: Janet [name] MRN: [id number] DOB: 08/17/1934 Today's Date: 01/18/2020    History of Present Illness Pt is a 85 y.o. F with significant PMH of CA, TIA, severe dementia who presents with right hip fracture after a mechanical fall 01/14/20. S/p prosthetic replacement for femoral neck fracture, hip hemiarthroplasty (anterior approach) by Dr. Erlinda Hong on 01/15/2020.    PT Comments    Pt presented in bed, pt appeared anxious with any touch yelling and asking PT to leave. Pt consistently repeated "don't touch my pussy", nursing staff and PT reassured pt she was safe and no one was going to hurt her. Pt responded best to reorientation statements "you are in the hospital", "I am your nurse", "we are females". Able to roll x2 to assist nursing with cleaning and sacral pad dressing changes. Adjusted frequency to x2 weekly, with d/c recommendations remaining the same. Will continue to follow acutely.   Follow Up Recommendations  SNF     Equipment Recommendations  Other (comment) (tbd)    Recommendations for Other Services       Precautions / Restrictions Precautions Precautions: Fall Restrictions Weight Bearing Restrictions: Yes RLE Weight Bearing: Weight bearing as tolerated    Mobility  Bed Mobility Overal bed mobility: Needs Assistance Bed Mobility: Rolling Rolling: Max assist         General bed mobility comments: Roll Bil sides x2 to assist nursing with dressing changes and bathing tasks  Transfers                 General transfer comment: deferred  Ambulation/Gait             General Gait Details: deferred   Stairs             Wheelchair Mobility    Modified Rankin (Stroke Patients Only)       Balance Overall balance assessment: Needs assistance   Sitting balance-Leahy Scale: Poor Sitting balance - Comments: Not observed, infered     Standing balance-Leahy Scale: Zero Standing balance comment: Not  observed, infered                            Cognition Arousal/Alertness: Awake/alert Behavior During Therapy: Anxious Overall Cognitive Status: History of cognitive impairments - at baseline Area of Impairment: Memory;Orientation;Following commands;Safety/judgement;Awareness;Problem solving                 Orientation Level: Disoriented to;Place;Time;Situation   Memory: Decreased short-term memory;Decreased recall of precautions Following Commands: Follows one step commands inconsistently;Follows one step commands with increased time Safety/Judgement: Decreased awareness of safety;Decreased awareness of deficits Awareness: Intellectual Problem Solving: Requires verbal cues General Comments: History of severe dementia. Pt very anxious, stating "don't kill me," "don't touch my pussy" unable to command follow. Able to perform some functional tasks such as drinking with min assist. Pt required soft music and reassurance throughout the session. Cues such as "there are only females here", "we are not going to hurt you", "I am your nurse, you are in the hospital" seemed to calm pt down.      Exercises      General Comments General comments (skin integrity, edema, etc.): Pt required consistent verbal cues for reassurance (see above cognition). Pt session was completed with nursing staff present to assist with calming pt and completion of therapuetic activities.      Pertinent Vitals/Pain Pain Assessment: Faces Faces Pain Scale: Hurts worst Pain Location: RLE with minimal  palpation or movement Pain Descriptors / Indicators: Guarding;Moaning Pain Intervention(s): Limited activity within patient's tolerance;Monitored during session;Repositioned    Home Living                      Prior Function            PT Goals (current goals can now be found in the care plan section) Acute Rehab PT Goals Patient Stated Goal: unable PT Goal Formulation: Patient unable to  participate in goal setting Time For Goal Achievement: 01/30/20 Potential to Achieve Goals: Fair Progress towards PT goals: Progressing toward goals    Frequency    Min 2X/week      PT Plan Frequency needs to be updated    Co-evaluation              AM-PAC PT "6 Clicks" Mobility   Outcome Measure  Help needed turning from your back to your side while in a flat bed without using bedrails?: Total Help needed moving from lying on your back to sitting on the side of a flat bed without using bedrails?: Total Help needed moving to and from a bed to a chair (including a wheelchair)?: Total Help needed standing up from a chair using your arms (e.g., wheelchair or bedside chair)?: Total Help needed to walk in hospital room?: Total Help needed climbing 3-5 steps with a railing? : Total 6 Click Score: 6    End of Session   Activity Tolerance: Patient limited by pain;Other (comment) (cognition) Patient left: in bed;with call bell/phone within reach;with bed alarm set Nurse Communication: Mobility status PT Visit Diagnosis: Pain;Other abnormalities of gait and mobility (R26.89);Muscle weakness (generalized) (M62.81);History of falling (Z91.81) Pain - Right/Left: Right Pain - part of body: Hip     Time: 0962-8366 PT Time Calculation (min) (ACUTE ONLY): 22 min  Charges:  $Therapeutic Activity: 8-22 mins                     [employer] SPT 01/18/2020    Rolland Porter 01/18/2020, 6:06 PM

## 2020-01-18 NOTE — Plan of Care (Signed)

## 2020-01-19 DIAGNOSIS — F0390 Unspecified dementia without behavioral disturbance: Secondary | ICD-10-CM

## 2020-01-19 LAB — CBC
HCT: 23.5 % — ABNORMAL LOW (ref 36.0–46.0)
Hemoglobin: 7.6 g/dL — ABNORMAL LOW (ref 12.0–15.0)
MCH: 29.3 pg (ref 26.0–34.0)
MCHC: 32.3 g/dL (ref 30.0–36.0)
MCV: 90.7 fL (ref 80.0–100.0)
Platelets: 159 10*3/uL (ref 150–400)
RBC: 2.59 MIL/uL — ABNORMAL LOW (ref 3.87–5.11)
RDW: 12.5 % (ref 11.5–15.5)
WBC: 7.7 10*3/uL (ref 4.0–10.5)
nRBC: 0 % (ref 0.0–0.2)

## 2020-01-19 LAB — BASIC METABOLIC PANEL
Anion gap: 13 (ref 5–15)
BUN: 22 mg/dL (ref 8–23)
CO2: 26 mmol/L (ref 22–32)
Calcium: 8.4 mg/dL — ABNORMAL LOW (ref 8.9–10.3)
Chloride: 100 mmol/L (ref 98–111)
Creatinine, Ser: 0.69 mg/dL (ref 0.44–1.00)
GFR calc Af Amer: >60 mL/min (ref >60)
GFR calc non Af Amer: >60 mL/min (ref >60)
Glucose, Bld: 113 mg/dL — ABNORMAL HIGH (ref 70–99)
Potassium: 3.8 mmol/L (ref 3.5–5.1)
Sodium: 139 mmol/L (ref 135–145)

## 2020-01-19 LAB — SARS CORONAVIRUS 2 (TAT 6-24 HRS): SARS Coronavirus 2: NEGATIVE

## 2020-01-19 NOTE — Progress Notes (Signed)
PROGRESS NOTE  Janet [name] TGG:[id number] DOB: 1935-04-10 DOA: 01/14/2020 PCP: Prince Solian, MD   LOS: 5 days   Brief Narrative / Interim history: 85 year old female with dementia, TIA, hyperlipidemia who was brought to the hospital from her nursing home for hip pain after a fall.  Underwent prosthetic replacement of femoral neck fracture by Dr. Erlinda Hong on 6/19  Subjective / 24h Interval events: No complaints, has underlying dementia  Assessment & Plan: Principal Problem Right femoral neck fracture due to mechanical fall-status post prosthetic replacement per Dr. Erlinda Hong on 6/19.  DVT prophylaxis with Lovenox.  She was initially given pain medication and muscle relaxers but felt to be quite lethargic and this have now been held  Active Problems Advanced dementia-continue home medication, frequent reorientation, delirium and fall precautions  Chronic thrombocytopenia-at baseline, platelets have actually normalized  Acute blood loss anemia-postoperatively, hemoglobin in the 7 range and improving, no need for blood transfusions  Thyroid nodule-underwent an ultrasound of the thyroid and recommendations are in place for FNA.  This can be arranged as an outpatient, advised PCP follow-up within the next 1 to 2 weeks  Hyperlipidemia-continue Lipitor  Essential hypertension-continue metoprolol   Scheduled Meds: . atorvastatin  40 mg Oral QPM  . cholecalciferol  1,000 Units Oral Daily  . darifenacin  7.5 mg Oral Daily  . divalproex  250 mg Oral BID  . docusate sodium  100 mg Oral BID  . donepezil  10 mg Oral BID  . enoxaparin (LOVENOX) injection  40 mg Subcutaneous Q24H  . escitalopram  10 mg Oral Daily  . feeding supplement (ENSURE ENLIVE)  237 mL Oral BID BM  . metoprolol succinate  25 mg Oral Daily  . mirtazapine  7.5 mg Oral QHS  . multivitamin with minerals  1 tablet Oral Daily  . tranexamic acid (CYKLOKAPRON) topical - INTRAOP  2,000 mg Topical Once  . zinc sulfate  220 mg Oral  Daily   Continuous Infusions: . sodium chloride 75 mL/hr at 01/15/20 2135  . methocarbamol (ROBAXIN) IV     PRN Meds:.acetaminophen, alum & mag hydroxide-simeth, HYDROcodone-acetaminophen, HYDROcodone-acetaminophen, magnesium citrate, magnesium hydroxide, menthol-cetylpyridinium **OR** phenol, methocarbamol **OR** methocarbamol (ROBAXIN) IV, morphine injection, ondansetron **OR** ondansetron (ZOFRAN) IV, polyethylene glycol, sorbitol  DVT prophylaxis: Lovenox Code Status: DNR Family Communication: no family at bedside   Status is: Inpatient  Remains inpatient appropriate because:Awaiting insurance authorization for SNF   Dispo: The patient is from: ALF              Anticipated d/c is to: SNF              Anticipated d/c date is: 1 day              Patient currently is medically stable to d/c.  Consultants:  Orthopedic surgery   Procedures:   Prosthetic replacement of femoral neck fracture  Microbiology  none  Antimicrobials: none    Objective: Vitals:   01/18/20 1950 01/19/20 0433 01/19/20 0827 01/19/20 1300  BP: (!) 143/65  (!) 149/59 (!) 144/91  Pulse: 92 73 82 96  Resp: 17 17 15 17   Temp: 99.5 F (37.5 C) 99.6 F (37.6 C) 98.6 F (37 C) 99.7 F (37.6 C)  TempSrc: Oral Oral Oral Oral  SpO2: 95% 96% 96% 99%  Weight:      Height:        Intake/Output Summary (Last 24 hours) at 01/19/2020 1415 Last data filed at 01/19/2020 0900 Gross per 24 hour  Intake 240 ml  Output 650 ml  Net -410 ml   Filed Weights   01/18/20 1054  Weight: 60.2 kg    Examination:  Constitutional: NAD, pleasant Eyes: no scleral icterus ENMT: Mucous membranes are moist.  Neck: normal, supple Respiratory: clear to auscultation bilaterally, no wheezing, no crackles. Cardiovascular: Regular rate and rhythm, no murmurs / rubs / gallops. No LE edema. Abdomen: non distended, no tenderness. Bowel sounds positive.  Musculoskeletal: no clubbing / cyanosis.  Skin: no  rashes Neurologic: Nonfocal, does not follow commands consistently   Data Reviewed: I have independently reviewed following labs and imaging studies   CBC: Recent Labs  Lab 01/14/20 2149 01/15/20 1201 01/16/20 0745 01/17/20 0335 01/19/20 0721  WBC 10.4 10.1 10.0 7.2 7.7  NEUTROABS 7.7  --   --   --   --   HGB 11.5* 10.3* 8.2* 7.1* 7.6*  HCT 35.1* 31.6* 25.1* 22.3* 23.5*  MCV 90.7 91.1 91.3 92.5 90.7  PLT 123* 112* 100* 89* 283   Basic Metabolic Panel: Recent Labs  Lab 01/14/20 2149 01/15/20 1201 01/16/20 0745 01/17/20 0335 01/19/20 0721  NA 142  --  140 139 139  K 3.5  --  3.6 3.5 3.8  CL 104  --  105 103 100  CO2 27  --  24 28 26   GLUCOSE 144*  --  141* 117* 113*  BUN 28*  --  27* 28* 22  CREATININE 0.90 0.93 0.87 0.87 0.69  CALCIUM 8.8*  --  8.1* 8.2* 8.4*   Liver Function Tests: No results for input(s): AST, ALT, ALKPHOS, BILITOT, PROT, ALBUMIN in the last 168 hours. Coagulation Profile: Recent Labs  Lab 01/14/20 2149  INR 1.0   HbA1C: No results for input(s): HGBA1C in the last 72 hours. CBG: No results for input(s): GLUCAP in the last 168 hours.  Recent Results (from the past 240 hour(s))  SARS Coronavirus 2 by RT PCR (hospital order, performed in [facility] hospital lab) Nasopharyngeal Nasopharyngeal Swab     Status: None   Collection Time: 01/14/20 10:33 PM   Specimen: Nasopharyngeal Swab  Result Value Ref Range Status   SARS Coronavirus 2 NEGATIVE NEGATIVE Final    Comment: (NOTE) SARS-CoV-2 target nucleic acids are NOT DETECTED.  The SARS-CoV-2 RNA is generally detectable in upper and lower respiratory specimens during the acute phase of infection. The lowest concentration of SARS-CoV-2 viral copies this assay can detect is 250 copies / mL. A negative result does not preclude SARS-CoV-2 infection and should not be used as the sole basis for treatment or other patient management decisions.  A negative result may occur with improper specimen  collection / handling, submission of specimen other than nasopharyngeal swab, presence of viral mutation(s) within the areas targeted by this assay, and inadequate number of viral copies (<250 copies / mL). A negative result must be combined with clinical observations, patient history, and epidemiological information.  Fact Sheet for Patients:   StrictlyIdeas.no  Fact Sheet for Healthcare Providers: BankingDealers.co.za  This test is not yet approved or  cleared by the [place] FDA and has been authorized for detection and/or diagnosis of SARS-CoV-2 by FDA under an Emergency Use Authorization (EUA).  This EUA will remain in effect (meaning this test can be used) for the duration of the COVID-19 declaration under Section 564(b)(1) of the Act, 21 U.S.C. section 360bbb-3(b)(1), unless the authorization is terminated or revoked sooner.  Performed at [facility], [place] [address] [place], [place] [address]  Radiology Studies: No results found.  Marzetta Board, MD, PhD [facility]  Between 7 am - 7 pm I am available, please contact me via Amion or Securechat  Between 7 pm - 7 am I am not available, please contact night coverage MD/APP via Amion

## 2020-01-19 NOTE — TOC Progression Note (Addendum)
Transition of Care [facility]) - Progression Note    Patient Details  Name: Cassady Stanczak MRN: [id number] Date of Birth: 1935/02/11  Transition of Care [facility]) CM/SW Contact  Sharin Mons, RN Phone Number: [phone] 01/19/2020, 9:13 AM  Clinical Narrative:    SNF bed offers noted. Pt's preference , [facility] accepted in the [place]. NCM awaiting call back from admission liaison regarding bed availiabilty. Insurance authorization pending.  Pt will need updated COVID, last noted COVID 6/18.  TOC  team will continue to follow and monitor for needs....  Expected Discharge Plan: [place] Barriers to Discharge: Continued Medical Work up, Ship broker, No SNF bed  Expected Discharge Plan and Services Expected Discharge Plan: [place] In-house Referral: Clinical Social Work     Living arrangements for the past 2 months: [place] (Memory Care)    Social Determinants of Health (SDOH) Interventions    Readmission Risk Interventions No flowsheet data found.

## 2020-01-19 NOTE — Plan of Care (Signed)
  Problem: Education: Goal: Knowledge of General Education information will improve Description: Including pain rating scale, medication(s)/side effects and non-pharmacologic comfort measures Outcome: Progressing   Problem: Clinical Measurements: Goal: Ability to maintain clinical measurements within normal limits will improve Outcome: Progressing   Problem: Nutrition: Goal: Adequate nutrition will be maintained Outcome: Progressing   Problem: Coping: Goal: Level of anxiety will decrease Outcome: Progressing   Problem: Elimination: Goal: Will not experience complications related to bowel motility Outcome: Progressing   Problem: Pain Managment: Goal: General experience of comfort will improve Outcome: Progressing   Problem: Safety: Goal: Ability to remain free from injury will improve Outcome: Progressing   Problem: Skin Integrity: Goal: Risk for impaired skin integrity will decrease Outcome: Progressing

## 2020-01-19 NOTE — Plan of Care (Signed)

## 2020-01-20 NOTE — Progress Notes (Signed)
Occupational Therapy Treatment Patient Details Name: Janet [name] MRN: [id number] DOB: 1935/01/14 Today's Date: 01/20/2020    History of present illness Pt is a 85 y.o. F with significant PMH of CA, TIA, severe dementia who presents with right hip fracture after a mechanical fall 01/14/20. S/p prosthetic replacement for femoral neck fracture, hip hemiarthroplasty (anterior approach) by Dr. Erlinda Hong on 01/15/2020.   OT comments  Pt not progressing towards OT goals. Remains total A with all ADLs, +2 for bed mobility. Pt noted to be lethargic throughout session and noticeably weaker than on OT eval. Will continue to follow at least for one more session to see if pt able to begin making any meaningful gains during OT session. Of note, edema noted in L hand. Positioned L forearm and hand on pillow and placed small, soft therapy ball in hand for edema control. D/c plan remains appropriate.    Follow Up Recommendations  SNF    Equipment Recommendations  None recommended by OT    Recommendations for Other Services      Precautions / Restrictions Precautions Precautions: Fall Restrictions Weight Bearing Restrictions: Yes RLE Weight Bearing: Weight bearing as tolerated       Mobility Bed Mobility                  Transfers                 General transfer comment: deferred    Balance                                           ADL either performed or assessed with clinical judgement   ADL Overall ADL's : Needs assistance/impaired Eating/Feeding: Total assistance;Bed level   Grooming: Total assistance                                 General ADL Comments: Raised HOB to more chair level during session with no response in pt's demeanor noted. Pt lethargic throughout session despite attempts to awaken. Pt would holler out in reponse increased voice volume and able to eat a few bited of food presented to her by NT at end of session. Pt appears  weaker and more fatigue compared to last OT session on Sunday.      Vision       Perception     Praxis      Cognition Arousal/Alertness: Lethargic Behavior During Therapy: Anxious Overall Cognitive Status: History of cognitive impairments - at baseline                                 General Comments: Pt with severe dementia at baseline. Very easily startled and awakens with screams of "don't touch me!" "don't hurt me"        Exercises     Shoulder Instructions       General Comments      Pertinent Vitals/ Pain       Pain Assessment: Faces Faces Pain Scale: Hurts little more Pain Location: Eyes closed and resting throughout session including R ankle ROM and LLE ROM exercises. When she does awaken on occasion she screams "Don't hurt me!" Pain Descriptors / Indicators: Guarding Pain Intervention(s): Monitored during session;Limited activity within patient's tolerance;Repositioned  Home Living  Prior Functioning/Environment              Frequency  Min 2X/week        Progress Toward Goals  OT Goals(current goals can now be found in the care plan section)  Progress towards OT goals: Not progressing toward goals - comment  Acute Rehab OT Goals Patient Stated Goal: unable OT Goal Formulation: Patient unable to participate in goal setting Time For Goal Achievement: 01/30/20 Potential to Achieve Goals: Poor ADL Goals Additional ADL Goal #1: Pt will complete rolling at max A level to facilitate bed level pericare. Additional ADL Goal #2: Pt will complete 2 ADL tasks with max A with bed in chair position. Additional ADL Goal #3: Pt will complete 2 bed level, pre-transfer activites with max A.  Plan Discharge plan remains appropriate    Co-evaluation                 AM-PAC OT "6 Clicks" Daily Activity     Outcome Measure   Help from another person eating meals?: Total Help  from another person taking care of personal grooming?: Total Help from another person toileting, which includes using toliet, bedpan, or urinal?: Total Help from another person bathing (including washing, rinsing, drying)?: Total Help from another person to put on and taking off regular upper body clothing?: Total Help from another person to put on and taking off regular lower body clothing?: Total 6 Click Score: 6    End of Session    OT Visit Diagnosis: Unsteadiness on feet (R26.81);Muscle weakness (generalized) (M62.81);Other abnormalities of gait and mobility (R26.89);History of falling (Z91.81);Other symptoms and signs involving cognitive function;Pain   Activity Tolerance Patient limited by lethargy   Patient Left in bed;with bed alarm set;with call bell/phone within reach;with SCD's reapplied (L forearm and hand elevated on pillow, small ball in hand)   Nurse Communication Other (comment) (L hand edema)        Time: 8177-1165 OT Time Calculation (min): 17 min  Charges: OT General Charges $OT Visit: 1 Visit OT Treatments $Self Care/Home Management : 8-22 mins  Tyrone Schimke, [place] Pager: [phone] Office: [phone]   Hortencia Pilar 01/20/2020, 2:02 PM

## 2020-01-20 NOTE — Plan of Care (Signed)

## 2020-01-20 NOTE — TOC Progression Note (Signed)
Transition of Care [facility]) - Progression Note    Patient Details  Name: Janet [name] MRN: [id number] Date of Birth: [birth year]  Transition of Care [facility]) CM/SW Contact  Sharin Mons, RN Phone Number: 01/20/2020, 9:14 AM  Clinical Narrative:    Insurance authorization for SNF pending... NCM received call from [facility] requesting updated PT clinicals. Clinicals faxed by NCM as requested.  TOC team will continue to monitor for needs....   Expected Discharge Plan: [place] [facility] SNF) Barriers to Discharge: Insurance Authorization  Expected Discharge Plan and Services Expected Discharge Plan: [place] [facility] SNF) In-house Referral: Clinical Social Work     Living arrangements for the past 2 months: [place] (Memory Care)                                       Social Determinants of Health (SDOH) Interventions    Readmission Risk Interventions No flowsheet data found.

## 2020-01-20 NOTE — TOC Transition Note (Signed)
Transition of Care [facility]) - CM/SW Discharge Note   Patient Details  Name: Janet [name] MRN: [id number] Date of Birth: Feb 15, 1935  Transition of Care [facility]) CM/SW Contact:  Sharin Mons, RN Phone Number: [phone] 01/20/2020, 1525   Clinical Narrative:    Patient will DC to: [facility] SNF Anticipated DC date: 01/20/2020 Family notified: Manuela Schwartz ( daughter) Transport by: Corey Harold   Per MD patient ready for DC today . RN, patient, patient's family, and facility notified of DC. Discharge Summary and FL2 sent to facility. RN to call report prior to discharge [phone]). DC packet on chart. Ambulance transport requested for patient.   RNCM will sign off for now as intervention is no longer needed. Please consult [place] again if new needs arise.  Kaylyn Layer (Daughter)     [phone]        Final next level of care: [facility] SNF) Barriers to Discharge: No Barriers Identified   Patient Goals and CMS Choice   CMS Medicare.gov Compare Post Acute Care list provided to:: Patient Represenative (must comment) Kaylyn Layer) Choice offered to / list presented to : [place] / [place]  Discharge Placement                       Discharge Plan and Services In-house Referral: Clinical Social Work                                   Social Determinants of Health (SDOH) Interventions     Readmission Risk Interventions No flowsheet data found.

## 2020-01-20 NOTE — Discharge Summary (Signed)
Physician Discharge Summary  Janet [name] TZG:[id number] DOB: Mar 28, 1935 DOA: 01/14/2020  PCP: Prince Solian, MD  Admit date: 01/14/2020 Discharge date: 01/20/2020  Admitted From: home Disposition:  SNF  Recommendations for Outpatient Follow-up:  1. Follow up with PCP in 1-2 weeks 2. Follow up with Orthopedic surgery in 2 weeks  Home Health: none Equipment/Devices: none  Discharge Condition: stable CODE STATUS: DNR Diet recommendation: regular  HPI: Per admitting MD, Janet [name] is a 85 y.o. female with medical history significant of dementia, hyperlipidemia, TIA presenting to the ED from her nursing home for evaluation of right hip pain after having a witnessed mechanical fall. She is not on any blood thinners.  Patient is oriented to self only.  States "Please don't mess with my leg, it's going to make me cry."  No additional history could be obtained from her.  Hospital Course / Discharge diagnoses: Principal Problem Right femoral neck fracture due to mechanical fall-status post prosthetic replacement per Dr. Erlinda Hong on 6/19.  She recovered well postoperatively, labs has remained stable, physical therapy recommended SNF and will be discharged to rehab in stable condition.  She will have DVT prophylaxis per orthopedic surgery with Lovenox.  Follow-up as an outpatient  Active Problems Advanced dementia-continue home medication, frequent reorientation, delirium and fall precautions Chronic thrombocytopenia-at baseline, platelets have actually normalized Acute blood loss anemia-postoperatively, hemoglobin in the 7 range and improving, no need for blood transfusions Thyroid nodule-underwent an ultrasound of the thyroid and recommendations are in place for FNA.  This can be arranged as an outpatient, advised PCP follow-up within the next 1 to 2 weeks Hyperlipidemia-continue Lipitor Essential hypertension-continue home medications  Discharge Instructions  Discharge Instructions     Weight bearing as tolerated   Complete by: As directed      Allergies as of 01/20/2020      Reactions   Aspirin Nausea And Vomiting   Penicillins Hives   Sulfa Antibiotics Nausea And Vomiting      Medication List    TAKE these medications   acetaminophen 500 MG tablet Commonly known as: TYLENOL Take 500 mg by mouth every 6 (six) hours as needed for moderate pain.   acetaminophen 325 MG tablet Commonly known as: TYLENOL Take 650 mg by mouth in the morning and at bedtime.   atorvastatin 40 MG tablet Commonly known as: LIPITOR Take 40 mg by mouth every evening.   cholecalciferol 25 MCG (1000 UNIT) tablet Commonly known as: VITAMIN D3 Take 1,000 Units by mouth daily.   divalproex 250 MG DR tablet Commonly known as: DEPAKOTE Take 250 mg by mouth 2 (two) times daily.   donepezil 10 MG tablet Commonly known as: ARICEPT Take 10 mg by mouth in the morning and at bedtime.   enoxaparin 40 MG/0.4ML injection Commonly known as: LOVENOX Inject 0.4 mLs (40 mg total) into the skin daily.   escitalopram 10 MG tablet Commonly known as: LEXAPRO Take 10 mg by mouth daily.   guaiFENesin 100 MG/5ML liquid Commonly known as: ROBITUSSIN Take 200 mg by mouth 4 (four) times daily as needed for cough or congestion.   HYDROcodone-acetaminophen 5-325 MG tablet Commonly known as: Norco Take 1-2 tablets by mouth 3 (three) times daily as needed.   loperamide 2 MG capsule Commonly known as: IMODIUM Take 2 mg by mouth as needed for diarrhea or loose stools.   magnesium hydroxide 400 MG/5ML suspension Commonly known as: MILK OF MAGNESIA Take 30 mLs by mouth at bedtime as needed for mild constipation.  metoprolol succinate 25 MG 24 hr tablet Commonly known as: TOPROL-XL Take 25 mg by mouth daily.   mirtazapine 7.5 MG tablet Commonly known as: REMERON Take 7.5 mg by mouth at bedtime.   Mylanta 200-200-20 MG/5ML suspension Generic drug: alum & mag hydroxide-simeth Take 30 mLs by  mouth every 6 (six) hours as needed for indigestion or heartburn.   solifenacin 5 MG tablet Commonly known as: VESICARE Take 5 mg by mouth daily.   VITAMIN C PO Take 500 mg by mouth in the morning and at bedtime.   Womens 50+ Multi Vitamin/Min Tabs Take 1 tablet by mouth daily.   zinc gluconate 50 MG tablet Take 50 mg by mouth in the morning and at bedtime.            Discharge Care Instructions  (From admission, onward)         Start     Ordered   01/15/20 0000  Weight bearing as tolerated        01/15/20 9518          Follow-up Information    Leandrew Koyanagi, MD In 2 weeks.   Specialty: Orthopedic Surgery Why: For suture removal, For wound re-check Contact information: [place] [id number] [phone]               Consultations:  Orthopedic surgery   Procedures/Studies:  Prosthetic replacement of femoral neck fracture  DG Chest 1 View  Result Date: 01/14/2020 CLINICAL DATA:  Status post fall. EXAM: CHEST  1 VIEW COMPARISON:  None. FINDINGS: There is no evidence of acute infiltrate, pleural effusion or pneumothorax. The heart size and mediastinal contours are within normal limits. The visualized skeletal structures are unremarkable. IMPRESSION: No active disease. Electronically Signed   By: Virgina Norfolk M.D.   On: 01/14/2020 21:28   CT HEAD WO CONTRAST  Result Date: 01/14/2020 CLINICAL DATA:  Witnessed fall today.  Dementia patient. EXAM: CT HEAD WITHOUT CONTRAST TECHNIQUE: Contiguous axial images were obtained from the base of the skull through the vertex without intravenous contrast. COMPARISON:  Most recent head CT 07/12/2011 FINDINGS: Brain: No intracranial hemorrhage, mass effect, or midline shift. Moderate generalized atrophy and chronic small vessel ischemia. Remote lacunar infarct in the left basal ganglia. No hydrocephalus. The basilar cisterns are patent. No evidence of territorial infarct or acute ischemia. No  extra-axial or intracranial fluid collection. Vascular: Atherosclerosis of skullbase vasculature without hyperdense vessel or abnormal calcification. Skull: No fracture or focal lesion. Sinuses/Orbits: Mucosal thickening of the ethmoid air cells and right greater than left maxillary sinus. Left mastoid air cells are hypo pneumatized. No acute findings. Left lens extraction. Other: None. IMPRESSION: 1. No acute intracranial abnormality. No skull fracture. 2. Generalized atrophy and chronic small vessel ischemia. Remote lacunar infarct in the left basal ganglia. Electronically Signed   By: Keith Rake M.D.   On: 01/14/2020 21:44   CT CERVICAL SPINE WO CONTRAST  Result Date: 01/14/2020 CLINICAL DATA:  Witnessed fall today.  Dementia patient. EXAM: CT CERVICAL SPINE WITHOUT CONTRAST TECHNIQUE: Multidetector CT imaging of the cervical spine was performed without intravenous contrast. Multiplanar CT image reconstructions were also generated. COMPARISON:  None. FINDINGS: Alignment: Trace anterolisthesis of C7 on T1 is likely facet mediated on degenerative. No traumatic subluxation. No jumped or perched facets. C1 is well aligned with C2. Skull base and vertebrae: No acute fracture. Vertebral body heights are maintained. The dens and skull base are intact. Soft tissues and spinal canal: No prevertebral  fluid or swelling. No visible canal hematoma. Disc levels: Mild diffuse degenerative disc disease with disc space narrowing and endplate spurring. There is multilevel facet hypertrophy. Upper chest: 17 mm hypodense nodule in the left lobe of the thyroid gland. No acute findings. Other: Carotid calcifications. IMPRESSION: 1. Mild degenerative change in the cervical spine without acute fracture or subluxation. 2. A 17 mm hypodense nodule in the left lobe of the thyroid gland. In the setting of significant comorbidities or limited life expectancy, no follow-up recommended (ref: J Am Coll Radiol. 2015 Feb;12(2): 143-50).  Electronically Signed   By: Keith Rake M.D.   On: 01/14/2020 21:47   Pelvis Portable  Result Date: 01/15/2020 CLINICAL DATA:  Right hip fracture post arthroplasty. EXAM: PORTABLE PELVIS 1-2 VIEWS COMPARISON:  01/14/2020 FINDINGS: Examination demonstrates expected changes post right hip arthroplasty which is intact and normally located. Mild degenerate change of the left hip. No acute fracture or dislocation. Mild degenerative change of the spine. IMPRESSION: Expected changes post right hip arthroplasty. Electronically Signed   By: Marin Olp M.D.   On: 01/15/2020 11:39   CT HIP RIGHT WO CONTRAST  Result Date: 01/14/2020 CLINICAL DATA:  Right hip fracture EXAM: CT OF THE RIGHT HIP WITHOUT CONTRAST TECHNIQUE: Multidetector CT imaging of the right hip was performed according to the standard protocol. Multiplanar CT image reconstructions were also generated. COMPARISON:  Radiograph same day FINDINGS: Bones/Joint/Cartilage There is a comminuted impacted fracture of the transcervical right femoral neck. There is superior and anterior subluxation with angulation of the femoral shaft. The femoral head is still well seated within the acetabulum. There is small fracture fragment seen adjacent to the femoral neck. A sclerotic foci seen within the inferior femoral head. There is diffuse osteopenia. No other definite fractures are identified. Ligaments Suboptimally assessed by CT. Muscles and Tendons The muscles surrounding the hip normal appearance without evidence of focal atrophy or tear. The visualized portions of the tendons appear to be grossly intact. Soft tissues Soft tissue swelling seen over the lateral aspect of the hip. Colonic diverticula are noted without diverticulitis. IMPRESSION: Comminuted impacted displaced anteriorly angulated transcervical right femoral neck fracture. Electronically Signed   By: Prudencio Pair M.D.   On: 01/14/2020 23:02   DG C-Arm 1-60 Min  Result Date:  01/15/2020 CLINICAL DATA:  Right hip fracture post right hip arthroplasty. EXAM: OPERATIVE RIGHT HIP (WITH PELVIS IF PERFORMED) 3 VIEWS TECHNIQUE: Fluoroscopic spot image(s) were submitted for interpretation post-operatively. COMPARISON:  01/14/2020 FINDINGS: Examination demonstrates evidence of patient's subcapital right femoral neck fracture with subsequent images demonstrate placement of a right hip arthroplasty intact and normally located. IMPRESSION: Expected changes post right hip arthroplasty. Electronically Signed   By: Marin Olp M.D.   On: 01/15/2020 11:38   US THYROID  Result Date: 01/18/2020 CLINICAL DATA:  Left thyroid nodule. Thyroid nodule noted on cervical spine CT. EXAM: THYROID ULTRASOUND TECHNIQUE: Ultrasound examination of the thyroid gland and adjacent soft tissues was performed. COMPARISON:  Cervical spine CT 01/13/2017 FINDINGS: Parenchymal Echotexture: Mildly heterogenous Isthmus: 0.3 cm Right lobe: 2.7 x 1.0 x 1.4 cm Left lobe: 3.3 x 1.7 x 1.8 cm _________________________________________________________ Estimated total number of nodules >/= 1 cm: 1 Number of spongiform nodules >/=  2 cm not described below (TR1): 0 Number of mixed cystic and solid nodules >/= 1.5 cm not described below (TR2): 0 _________________________________________________________ Nodule # 1: Location: Left; Mid Maximum size: 2.0 cm; Other 2 dimensions: 1.3 x 1.5 cm Composition: solid/almost completely solid (2)  Echogenicity: hypoechoic (2) Shape: not taller-than-wide (0) Margins: ill-defined (0) Echogenic foci: none (0) ACR TI-RADS total points: 4. ACR TI-RADS risk category: TR4 (4-6 points). ACR TI-RADS recommendations: **Given size (>/= 1.5 cm) and appearance, fine needle aspiration of this moderately suspicious nodule should be considered based on TI-RADS criteria. _________________________________________________________ No discrete right thyroid nodule. IMPRESSION: Left thyroid nodule measures up to 2.0 cm  and compatible with a TR 4 nodule. This nodule meets criteria for ultrasound-guided biopsy. The above is in keeping with the ACR TI-RADS recommendations - J Am Coll Radiol 2017;14:587-595. Electronically Signed   By: Markus Daft M.D.   On: 01/18/2020 08:14   DG HIP OPERATIVE UNILAT W OR W/O PELVIS RIGHT  Result Date: 01/15/2020 CLINICAL DATA:  Right hip fracture post right hip arthroplasty. EXAM: OPERATIVE RIGHT HIP (WITH PELVIS IF PERFORMED) 3 VIEWS TECHNIQUE: Fluoroscopic spot image(s) were submitted for interpretation post-operatively. COMPARISON:  01/14/2020 FINDINGS: Examination demonstrates evidence of patient's subcapital right femoral neck fracture with subsequent images demonstrate placement of a right hip arthroplasty intact and normally located. IMPRESSION: Expected changes post right hip arthroplasty. Electronically Signed   By: Marin Olp M.D.   On: 01/15/2020 11:38   DG Hip Unilat  With Pelvis 2-3 Views Right  Result Date: 01/14/2020 CLINICAL DATA:  Status post fall. EXAM: DG HIP (WITH OR WITHOUT PELVIS) 2-3V RIGHT COMPARISON:  None. FINDINGS: Acute fracture deformity is seen extending through the neck of the proximal right femur. Approximately 1/2 shaft width superior displacement of the distal fracture site is seen. There is no evidence of dislocation. There is no evidence of significant arthropathy or other focal bone abnormality. IMPRESSION: Acute fracture of the proximal right femur. Electronically Signed   By: Virgina Norfolk M.D.   On: 01/14/2020 21:26   DG FEMUR, MIN 2 VIEWS RIGHT  Result Date: 01/14/2020 CLINICAL DATA:  Status post fall. EXAM: RIGHT FEMUR 2 VIEWS COMPARISON:  None. FINDINGS: Acute fracture deformity is seen extending through the neck of the proximal right femur. Approximately 1/2 shaft width superior displacement of the distal fracture site is noted. There is no evidence of associated dislocation. Soft tissue structures are unremarkable. IMPRESSION: Acute  fracture of the proximal right femur. Electronically Signed   By: Virgina Norfolk M.D.   On: 01/14/2020 21:27      Subjective: -confused, alert, no complaints.   Discharge Exam: BP 127/60 (BP Location: Left Arm)   Pulse 93   Temp 99.4 F (37.4 C) (Oral)   Resp 14   Ht 5\' 1"  (1.549 m) Comment: from [facility] on 05/06/14  Wt 60.2 kg Comment: bedscale  SpO2 98%   BMI 25.08 kg/m   General: Pt is alert, awake, not in acute distress Cardiovascular: RRR, S1/S2 +, no rubs, no gallops Respiratory: CTA bilaterally, no wheezing, no rhonchi Abdominal: Soft, NT, ND, bowel sounds    The results of significant diagnostics from this hospitalization (including imaging, microbiology, ancillary and laboratory) are listed below for reference.     Microbiology: Recent Results (from the past 240 hour(s))  SARS Coronavirus 2 by RT PCR (hospital order, performed in [facility] hospital lab) Nasopharyngeal Nasopharyngeal Swab     Status: None   Collection Time: 01/14/20 10:33 PM   Specimen: Nasopharyngeal Swab  Result Value Ref Range Status   SARS Coronavirus 2 NEGATIVE NEGATIVE Final    Comment: (NOTE) SARS-CoV-2 target nucleic acids are NOT DETECTED.  The SARS-CoV-2 RNA is generally detectable in upper and lower respiratory specimens during the  acute phase of infection. The lowest concentration of SARS-CoV-2 viral copies this assay can detect is 250 copies / mL. A negative result does not preclude SARS-CoV-2 infection and should not be used as the sole basis for treatment or other patient management decisions.  A negative result may occur with improper specimen collection / handling, submission of specimen other than nasopharyngeal swab, presence of viral mutation(s) within the areas targeted by this assay, and inadequate number of viral copies (<250 copies / mL). A negative result must be combined with clinical observations, patient history, and epidemiological  information.  Fact Sheet for Patients:   StrictlyIdeas.no  Fact Sheet for Healthcare Providers: BankingDealers.co.za  This test is not yet approved or  cleared by the [place] FDA and has been authorized for detection and/or diagnosis of SARS-CoV-2 by FDA under an Emergency Use Authorization (EUA).  This EUA will remain in effect (meaning this test can be used) for the duration of the COVID-19 declaration under Section 564(b)(1) of the Act, 21 U.S.C. section 360bbb-3(b)(1), unless the authorization is terminated or revoked sooner.  Performed at [facility], [place] [address], [place], [place] [address]   SARS CORONAVIRUS 2 (TAT 6-24 HRS) Nasopharyngeal Nasopharyngeal Swab     Status: None   Collection Time: 01/19/20  5:01 PM   Specimen: Nasopharyngeal Swab  Result Value Ref Range Status   SARS Coronavirus 2 NEGATIVE NEGATIVE Final    Comment: (NOTE) SARS-CoV-2 target nucleic acids are NOT DETECTED.  The SARS-CoV-2 RNA is generally detectable in upper and lower respiratory specimens during the acute phase of infection. Negative results do not preclude SARS-CoV-2 infection, do not rule out co-infections with other pathogens, and should not be used as the sole basis for treatment or other patient management decisions. Negative results must be combined with clinical observations, patient history, and epidemiological information. The expected result is Negative.  Fact Sheet for Patients: SugarRoll.be  Fact Sheet for Healthcare Providers: https://www.woods-mathews.com/  This test is not yet approved or cleared by the [place] FDA and  has been authorized for detection and/or diagnosis of SARS-CoV-2 by FDA under an Emergency Use Authorization (EUA). This EUA will remain  in effect (meaning this test can be used) for the duration of the COVID-19 declaration under Se  ction 564(b)(1) of the Act, 21 U.S.C. section 360bbb-3(b)(1), unless the authorization is terminated or revoked sooner.  Performed at [facility], [place] [address] [place], [place] [address]      Labs: Basic Metabolic Panel: Recent Labs  Lab 01/14/20 2149 01/15/20 1201 01/16/20 0745 01/17/20 0335 01/19/20 0721  NA 142  --  140 139 139  K 3.5  --  3.6 3.5 3.8  CL 104  --  105 103 100  CO2 27  --  24 28 26   GLUCOSE 144*  --  141* 117* 113*  BUN 28*  --  27* 28* 22  CREATININE 0.90 0.93 0.87 0.87 0.69  CALCIUM 8.8*  --  8.1* 8.2* 8.4*   Liver Function Tests: No results for input(s): AST, ALT, ALKPHOS, BILITOT, PROT, ALBUMIN in the last 168 hours. CBC: Recent Labs  Lab 01/14/20 2149 01/15/20 1201 01/16/20 0745 01/17/20 0335 01/19/20 0721  WBC 10.4 10.1 10.0 7.2 7.7  NEUTROABS 7.7  --   --   --   --   HGB 11.5* 10.3* 8.2* 7.1* 7.6*  HCT 35.1* 31.6* 25.1* 22.3* 23.5*  MCV 90.7 91.1 91.3 92.5 90.7  PLT 123* 112* 100* 89*  159   CBG: No results for input(s): GLUCAP in the last 168 hours. Hgb A1c No results for input(s): HGBA1C in the last 72 hours. Lipid Profile No results for input(s): CHOL, HDL, LDLCALC, TRIG, CHOLHDL, LDLDIRECT in the last 72 hours. Thyroid function studies No results for input(s): TSH, T4TOTAL, T3FREE, THYROIDAB in the last 72 hours.  Invalid input(s): FREET3 Urinalysis    Component Value Date/Time   COLORURINE YELLOW 02/25/2011 1021   APPEARANCEUR CLEAR 02/25/2011 1021   LABSPEC 1.008 02/25/2011 1021   PHURINE 7.5 02/25/2011 1021   GLUCOSEU NEGATIVE 02/25/2011 1021   HGBUR NEGATIVE 02/25/2011 1021   BILIRUBINUR NEGATIVE 02/25/2011 1021   KETONESUR NEGATIVE 02/25/2011 1021   PROTEINUR NEGATIVE 02/25/2011 1021   UROBILINOGEN 0.2 02/25/2011 1021   NITRITE NEGATIVE 02/25/2011 1021   LEUKOCYTESUR NEGATIVE 02/25/2011 1021    FURTHER DISCHARGE INSTRUCTIONS:   Get Medicines reviewed and adjusted: Please take all your medications  with you for your next visit with your Primary MD   Laboratory/radiological data: Please request your Primary MD to go over all hospital tests and procedure/radiological results at the follow up, please ask your Primary MD to get all Hospital records sent to his/her office.   In some cases, they will be blood work, cultures and biopsy results pending at the time of your discharge. Please request that your primary care M.D. goes through all the records of your hospital data and follows up on these results.   Also Note the following: If you experience worsening of your admission symptoms, develop shortness of breath, life threatening emergency, suicidal or homicidal thoughts you must seek medical attention immediately by calling 911 or calling your MD immediately  if symptoms less severe.   You must read complete instructions/literature along with all the possible adverse reactions/side effects for all the Medicines you take and that have been prescribed to you. Take any new Medicines after you have completely understood and accpet all the possible adverse reactions/side effects.    Do not drive when taking Pain medications or sleeping medications (Benzodaizepines)   Do not take more than prescribed Pain, Sleep and Anxiety Medications. It is not advisable to combine anxiety,sleep and pain medications without talking with your primary care practitioner   Special Instructions: If you have smoked or chewed Tobacco  in the last 2 yrs please stop smoking, stop any regular Alcohol  and or any Recreational drug use.   Wear Seat belts while driving.   Please note: You were cared for by a hospitalist during your hospital stay. Once you are discharged, your primary care physician will handle any further medical issues. Please note that NO REFILLS for any discharge medications will be authorized once you are discharged, as it is imperative that you return to your primary care physician (or establish a  relationship with a primary care physician if you do not have one) for your post hospital discharge needs so that they can reassess your need for medications and monitor your lab values.  Time coordinating discharge: 40 minutes  SIGNED:  Marzetta Board, MD, PhD 01/20/2020, 12:54 PM

## 2020-01-20 NOTE — Progress Notes (Signed)
Called d/c report to nurse Corky Sing) in [place], voiced understanding , questions were answered. PTAR came and left with discharge papers around 1035p. V/S WNL. PRN Tylenol 650mg  provided for possible pain during transportation. Meds were all given for tonight.

## 2020-01-20 NOTE — Plan of Care (Signed)

## 2020-01-21 ENCOUNTER — Telehealth: Payer: Self-pay

## 2020-01-21 ENCOUNTER — Encounter: Payer: Self-pay | Admitting: Internal Medicine

## 2020-01-21 ENCOUNTER — Non-Acute Institutional Stay (SKILLED_NURSING_FACILITY): Payer: Medicare Other | Admitting: Internal Medicine

## 2020-01-21 DIAGNOSIS — D696 Thrombocytopenia, unspecified: Secondary | ICD-10-CM

## 2020-01-21 DIAGNOSIS — S72001A Fracture of unspecified part of neck of right femur, initial encounter for closed fracture: Secondary | ICD-10-CM | POA: Diagnosis not present

## 2020-01-21 DIAGNOSIS — D62 Acute posthemorrhagic anemia: Secondary | ICD-10-CM | POA: Insufficient documentation

## 2020-01-21 DIAGNOSIS — E041 Nontoxic single thyroid nodule: Secondary | ICD-10-CM

## 2020-01-21 NOTE — Telephone Encounter (Signed)
WBAT

## 2020-01-21 NOTE — Assessment & Plan Note (Signed)
No bleeding dyscrasias reported SNF. °

## 2020-01-21 NOTE — Telephone Encounter (Signed)
Janet [name], PT with [facility] would like to request WB status for right LE.  Patient had right hip surgery on 01/15/2020.  CB# [phone].  Please advise.  Thank you.

## 2020-01-21 NOTE — Progress Notes (Signed)
   NURSING HOME LOCATION:  [facility] ROOM NUMBER:  [id number]  CODE STATUS:  DNR  PCP:  Prince Solian, MD [place]  [id number]   This is a comprehensive admission note to [facility] performed on this date less than 30 days from date of admission. Included are preadmission medical/surgical history; reconciled medication list; family history; social history and comprehensive review of systems.  Corrections and additions to the records were documented. Comprehensive physical exam was also performed. Additionally a clinical summary was entered for each active diagnosis pertinent to this admission in the Problem List to enhance continuity of care.  HPI: Patient was hospitalized 6/18-6/24/2021 admitted from a nursing home after a witnessed mechanical fall.  This is in the context of severe dementia with orientation to self only. Right hip hemiarthroplasty was performed 6/19 by Dr Erlinda Hong, Orthopedist.  Postoperatively hemoglobin was in the 7 range which improved serially without need for blood transfusion.  At admission hemoglobin 11.5/hematocrit 35.1.  [place] H/H was 7.1/22.3. Despite a history of chronic thrombocytopenia; platelets actually normalized.  Platelet count was 123,000 at admission but dropped to 89,000.  Platelet count was 159,000 at discharge. Postop DVT prophylaxis was with Lovenox.  An incidental finding on CT of cervical spine was a thyroid nodule.Ultrasound of the thyroid revealed a nodule which meets criteria for FNA; this is recommended after discharge from the SNFpost rehab.  Past medical and surgical history: Includes history of TIA,  vascular dementia, essential hypertension, dyslipidemia, and history of cancer (primary not stated).  Social history: Nondrinker. Never smoked  Family history: Noncontributory due to advanced age.   Review of systems:  Could not be completed due to dementia.  She refused to open her eyes or follow commands.  Her only  verbal responses were "are you a girl?" and " do not mess with my (she then employed the slang word for vagina!")  Physical exam:  Pertinent or positive findings: She appears chronically ill.  Hair is white and disheveled.  As noted she would not open her eyes.  Oral exam was limited.  She appears to be completely edentulous.  She exhibits a mastication tremor of the mandible.  I could not palpate a thyroid nodule.  When I began to examine the neck she cried out as if in pain.  She also exhibited coarse tremor of the upper extremities.  Pedal pulses were decreased.  Strength could not be tested as she would not follow commands.Small eschar L upper temple present.  General appearance: no acute distress, increased work of breathing is present.   Lymphatic: No lymphadenopathy about the head, neck, axilla. Ears:  External ear exam shows no significant lesions or deformities.   Nose:  External nasal examination shows no deformity or inflammation. Nasal mucosa are pink and moist without lesions, exudates Neck:  No thyromegaly, masses noted.    Heart:  Normal rate and regular rhythm. S1 and S2 normal without gallop, murmur, click, rub.  Lungs: Chest clear to auscultation without wheezes, rhonchi, rales, rubs. Abdomen: Bowel sounds are normal.  Abdomen is soft and nontender with no organomegaly, hernias, masses. GU: Deferred  Extremities:  No cyanosis, clubbing, edema. Neurologic exam:  Balance, Rhomberg, finger to nose testing could not be completed due to clinical state Skin: Warm & dry w/o tenting. No significant lesions or rash.  See clinical summary under each active problem in the Problem List with associated updated therapeutic plan

## 2020-01-21 NOTE — Telephone Encounter (Signed)
IC advised.  

## 2020-01-21 NOTE — Patient Instructions (Signed)
See assessment and plan under each diagnosis in the problem list and acutely for this visit 

## 2020-01-21 NOTE — Assessment & Plan Note (Signed)
FNA to be scheduled post rehab at [facility].

## 2020-01-21 NOTE — Assessment & Plan Note (Signed)
Recheck CBC 6/28 Initiate ferrous sulfate

## 2020-01-22 NOTE — Assessment & Plan Note (Signed)
Rehab at SNF as allowed by dementia

## 2020-01-24 ENCOUNTER — Other Ambulatory Visit: Payer: Self-pay

## 2020-01-24 ENCOUNTER — Encounter (HOSPITAL_COMMUNITY): Payer: Self-pay

## 2020-01-24 ENCOUNTER — Inpatient Hospital Stay (HOSPITAL_COMMUNITY)
Admission: EM | Admit: 2020-01-24 | Discharge: 2020-01-31 | DRG: 812 | Disposition: A | Discharge status: Skilled Nursing Facility | Payer: Medicare Other | Source: Skilled Nursing Facility | Attending: Internal Medicine | Admitting: Internal Medicine

## 2020-01-24 DIAGNOSIS — R7989 Other specified abnormal findings of blood chemistry: Secondary | ICD-10-CM | POA: Diagnosis not present

## 2020-01-24 DIAGNOSIS — Z96641 Presence of right artificial hip joint: Secondary | ICD-10-CM | POA: Diagnosis present

## 2020-01-24 DIAGNOSIS — Z882 Allergy status to sulfonamides status: Secondary | ICD-10-CM

## 2020-01-24 DIAGNOSIS — E041 Nontoxic single thyroid nodule: Secondary | ICD-10-CM | POA: Diagnosis present

## 2020-01-24 DIAGNOSIS — D649 Anemia, unspecified: Secondary | ICD-10-CM | POA: Diagnosis not present

## 2020-01-24 DIAGNOSIS — D62 Acute posthemorrhagic anemia: Secondary | ICD-10-CM | POA: Diagnosis not present

## 2020-01-24 DIAGNOSIS — Z20822 Contact with and (suspected) exposure to covid-19: Secondary | ICD-10-CM | POA: Diagnosis present

## 2020-01-24 DIAGNOSIS — F0390 Unspecified dementia without behavioral disturbance: Secondary | ICD-10-CM | POA: Diagnosis not present

## 2020-01-24 DIAGNOSIS — I10 Essential (primary) hypertension: Secondary | ICD-10-CM | POA: Diagnosis present

## 2020-01-24 DIAGNOSIS — R9431 Abnormal electrocardiogram [ECG] [EKG]: Secondary | ICD-10-CM | POA: Diagnosis present

## 2020-01-24 DIAGNOSIS — E876 Hypokalemia: Secondary | ICD-10-CM | POA: Diagnosis present

## 2020-01-24 DIAGNOSIS — Z886 Allergy status to analgesic agent status: Secondary | ICD-10-CM

## 2020-01-24 DIAGNOSIS — F329 Major depressive disorder, single episode, unspecified: Secondary | ICD-10-CM | POA: Diagnosis present

## 2020-01-24 DIAGNOSIS — Z79899 Other long term (current) drug therapy: Secondary | ICD-10-CM

## 2020-01-24 DIAGNOSIS — Z66 Do not resuscitate: Secondary | ICD-10-CM | POA: Diagnosis present

## 2020-01-24 DIAGNOSIS — E785 Hyperlipidemia, unspecified: Secondary | ICD-10-CM | POA: Diagnosis present

## 2020-01-24 DIAGNOSIS — F0391 Unspecified dementia with behavioral disturbance: Secondary | ICD-10-CM | POA: Diagnosis present

## 2020-01-24 DIAGNOSIS — F05 Delirium due to known physiological condition: Secondary | ICD-10-CM | POA: Diagnosis present

## 2020-01-24 DIAGNOSIS — E8809 Other disorders of plasma-protein metabolism, not elsewhere classified: Secondary | ICD-10-CM | POA: Diagnosis present

## 2020-01-24 DIAGNOSIS — Z8673 Personal history of transient ischemic attack (TIA), and cerebral infarction without residual deficits: Secondary | ICD-10-CM

## 2020-01-24 DIAGNOSIS — Z880 Allergy status to penicillin: Secondary | ICD-10-CM

## 2020-01-24 DIAGNOSIS — E7800 Pure hypercholesterolemia, unspecified: Secondary | ICD-10-CM | POA: Diagnosis present

## 2020-01-24 LAB — COMPREHENSIVE METABOLIC PANEL
ALT: 57 U/L — ABNORMAL HIGH (ref 0–44)
AST: 43 U/L — ABNORMAL HIGH (ref 15–41)
Albumin: 2.1 g/dL — ABNORMAL LOW (ref 3.5–5.0)
Alkaline Phosphatase: 66 U/L (ref 38–126)
Anion gap: 9 (ref 5–15)
BUN: 27 mg/dL — ABNORMAL HIGH (ref 8–23)
CO2: 29 mmol/L (ref 22–32)
Calcium: 8.2 mg/dL — ABNORMAL LOW (ref 8.9–10.3)
Chloride: 103 mmol/L (ref 98–111)
Creatinine, Ser: 0.67 mg/dL (ref 0.44–1.00)
GFR calc Af Amer: >60 mL/min (ref >60)
GFR calc non Af Amer: >60 mL/min (ref >60)
Glucose, Bld: 148 mg/dL — ABNORMAL HIGH (ref 70–99)
Potassium: 3.1 mmol/L — ABNORMAL LOW (ref 3.5–5.1)
Sodium: 141 mmol/L (ref 135–145)
Total Bilirubin: 0.7 mg/dL (ref 0.3–1.2)
Total Protein: 5.5 g/dL — ABNORMAL LOW (ref 6.5–8.1)

## 2020-01-24 LAB — CBC WITH DIFFERENTIAL/PLATELET
Abs Immature Granulocytes: 0.36 10*3/uL — ABNORMAL HIGH (ref 0.00–0.07)
Basophils Absolute: 0 10*3/uL (ref 0.0–0.1)
Basophils Relative: 0 %
Eosinophils Absolute: 0.2 10*3/uL (ref 0.0–0.5)
Eosinophils Relative: 3 %
HCT: 22.7 % — ABNORMAL LOW (ref 36.0–46.0)
Hemoglobin: 7 g/dL — ABNORMAL LOW (ref 12.0–15.0)
Immature Granulocytes: 5 %
Lymphocytes Relative: 18 %
Lymphs Abs: 1.3 10*3/uL (ref 0.7–4.0)
MCH: 28.7 pg (ref 26.0–34.0)
MCHC: 30.8 g/dL (ref 30.0–36.0)
MCV: 93 fL (ref 80.0–100.0)
Monocytes Absolute: 0.7 10*3/uL (ref 0.1–1.0)
Monocytes Relative: 10 %
Neutro Abs: 4.6 10*3/uL (ref 1.7–7.7)
Neutrophils Relative %: 64 %
Platelets: 303 10*3/uL (ref 150–400)
RBC: 2.44 MIL/uL — ABNORMAL LOW (ref 3.87–5.11)
RDW: 13.1 % (ref 11.5–15.5)
WBC: 7.2 10*3/uL (ref 4.0–10.5)
nRBC: 1.5 % — ABNORMAL HIGH (ref 0.0–0.2)

## 2020-01-24 LAB — POC OCCULT BLOOD, ED: Fecal Occult Bld: NEGATIVE

## 2020-01-24 LAB — RETICULOCYTES
Immature Retic Fract: 31.4 % — ABNORMAL HIGH (ref 2.3–15.9)
RBC.: 2.37 MIL/uL — ABNORMAL LOW (ref 3.87–5.11)
Retic Count, Absolute: 72.3 10*3/uL (ref 19.0–186.0)
Retic Ct Pct: 3.1 % (ref 0.4–3.1)

## 2020-01-24 LAB — IRON AND TIBC
Iron: 22 ug/dL — ABNORMAL LOW (ref 28–170)
Saturation Ratios: 12 % (ref 10.4–31.8)
TIBC: 186 ug/dL — ABNORMAL LOW (ref 250–450)
UIBC: 164 ug/dL

## 2020-01-24 LAB — FERRITIN: Ferritin: 1005 ng/mL — ABNORMAL HIGH (ref 11–307)

## 2020-01-24 LAB — VITAMIN B12: Vitamin B-12: 913 pg/mL (ref 180–914)

## 2020-01-24 LAB — SARS CORONAVIRUS 2 BY RT PCR (HOSPITAL ORDER, PERFORMED IN ~~LOC~~ HOSPITAL LAB): SARS Coronavirus 2: NEGATIVE

## 2020-01-24 LAB — FOLATE: Folate: 34.5 ng/mL (ref >5.9)

## 2020-01-24 LAB — PREPARE RBC (CROSSMATCH)

## 2020-01-24 MED ORDER — SODIUM CHLORIDE 0.9 % IV SOLN
10.0000 mL/h | Freq: Once | INTRAVENOUS | Status: AC
  Administered 2020-01-24: 10 mL/h via INTRAVENOUS

## 2020-01-24 MED ORDER — HYDROCODONE-ACETAMINOPHEN 5-325 MG PO TABS: 1.0000 | ORAL_TABLET | Freq: Three times a day (TID) | ORAL | 0 refills | Status: DC | PRN

## 2020-01-24 MED ORDER — PANTOPRAZOLE SODIUM 40 MG PO TBEC
40.0000 mg | DELAYED_RELEASE_TABLET | Freq: Two times a day (BID) | ORAL | Status: DC
  Administered 2020-01-24 – 2020-01-31 (×14): 40 mg via ORAL
  Filled 2020-01-24 (×14): qty 1

## 2020-01-24 MED ORDER — POTASSIUM CHLORIDE 10 MEQ/100ML IV SOLN
10.0000 meq | INTRAVENOUS | Status: AC
  Administered 2020-01-25 (×4): 10 meq via INTRAVENOUS
  Filled 2020-01-24 (×4): qty 100

## 2020-01-24 NOTE — ED Notes (Signed)
Laqueta Due brother [phone] would like an update on pt

## 2020-01-24 NOTE — ED Notes (Signed)
[place] [phone]
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
## 2020-01-24 NOTE — ED Triage Notes (Addendum)
Pt BIB [facility] from [facility] with Low Hgb.  Pt broke Femur 10 days ago and was at [facility] and per facility Hgb was: 6.8.  Pt has no complaints other than pain from femur. Pt has no obvious signs of bleeding.   Baseline Dementia  VSS with EMS:  142/54 80 HR 15 RR 95% RA

## 2020-01-24 NOTE — ED Provider Notes (Signed)
[facility] EMERGENCY DEPARTMENT Provider Note   CSN: [id number] Arrival date & time: 01/24/20  1638     History Chief Complaint  Patient presents with  . Anemia    Janet [name] is a 85 y.o. female with pertinent past medical history of advanced dementia, recent mechanical fall leading to right femoral neck fracture status post hemiarthroplasty by Dr. Erlinda Hong on 3/26 complicated by postoperative blood loss anemia presents to the ED for evaluation of low hemoglobin.  Level 5 caveat due to advanced dementia.  Patient can only tell me her name.  Denies any pain.  She is asking for water.  History obtained from documentation sent with patient from living facility.  Her hemoglobin was 6.8 earlier this morning and she was transferred for "severe anemia".  HPI     Past Medical History:  Diagnosis Date  . Cancer ([place])   . High cholesterol   . Neuralgia facialis vera   . TIA (transient ischemic attack)     Patient Active Problem List   Diagnosis Date Noted  . Acute blood loss as cause of postoperative anemia 01/21/2020  . Closed displaced fracture of right femoral neck ([place]) 01/14/2020  . Thyroid nodule 01/14/2020  . Thrombocytopenia ([place]) 01/14/2020  . Dementia ([place]) 01/14/2020  . TIA (transient ischemic attack)   . High cholesterol   . Neuralgia facialis vera     Past Surgical History:  Procedure Laterality Date  . ANTERIOR APPROACH HEMI HIP ARTHROPLASTY Right 01/15/2020   Procedure: ANTERIOR APPROACH HEMI HIP ARTHROPLASTY;  Surgeon: Leandrew Koyanagi, MD;  Location: [facility];  Service: Orthopedics;  Laterality: Right;     OB History   No obstetric history on file.     No family history on file.  Social History   Tobacco Use  . Smoking status: Never Smoker  . Smokeless tobacco: Never Used  Substance Use Topics  . Alcohol use: No  . Drug use: No    Home Medications Prior to Admission medications   Medication Sig Start Date End Date Taking? Authorizing  Provider  acetaminophen (TYLENOL) 325 MG tablet Take 650 mg by mouth in the morning and at bedtime.    [provider]  acetaminophen (TYLENOL) 500 MG tablet Take 500 mg by mouth 2 (two) times daily as needed for moderate pain.     [provider]  alum & mag hydroxide-simeth (MYLANTA) 200-200-20 MG/5ML suspension Take 30 mLs by mouth every 6 (six) hours as needed for indigestion or heartburn.    [provider]  ascorbic acid (VITAMIN C) 500 MG tablet Take 500 mg by mouth daily.    [provider]  atorvastatin (LIPITOR) 40 MG tablet Take 40 mg by mouth every evening.    [provider]  cholecalciferol (VITAMIN D3) 25 MCG (1000 UNIT) tablet Take 1,000 Units by mouth daily.    [provider]  divalproex (DEPAKOTE) 250 MG DR tablet Take 250 mg by mouth 2 (two) times daily.    [provider]  donepezil (ARICEPT) 10 MG tablet Take 10 mg by mouth in the morning and at bedtime.    [provider]  enoxaparin (LOVENOX) 40 MG/0.4ML injection Inject 0.4 mLs (40 mg total) into the skin daily. 01/15/20   Leandrew Koyanagi, MD  escitalopram (LEXAPRO) 10 MG tablet Take 10 mg by mouth daily.    [provider]  guaiFENesin (ROBITUSSIN) 100 MG/5ML liquid Take 200 mg by mouth 4 (four) times daily as needed for  cough or congestion.    [provider]  HYDROcodone-acetaminophen (NORCO) 5-325 MG tablet Take 1 tablet by mouth 3 (three) times daily as needed. 01/24/20   Medina-Vargas, Monina C, NP  magnesium hydroxide (MILK OF MAGNESIA) 400 MG/5ML suspension Take 30 mLs by mouth at bedtime as needed for mild constipation.    [provider]  metoprolol succinate (TOPROL-XL) 25 MG 24 hr tablet Take 25 mg by mouth daily.    [provider]  mirtazapine (REMERON) 7.5 MG tablet Take 7.5 mg by mouth at bedtime.    [provider]  Multiple Vitamins-Minerals (WOMENS 50+ MULTI VITAMIN/MIN) TABS Take 1 tablet by  mouth daily.    [provider]  solifenacin (VESICARE) 5 MG tablet Take 5 mg by mouth daily.     [provider]  zinc gluconate 50 MG tablet Take 50 mg by mouth in the morning and at bedtime.    [provider]    Allergies    Aspirin, Penicillins, and Sulfa antibiotics  Review of Systems   Review of Systems  Unable to perform ROS: Dementia  All other systems reviewed and are negative.   Physical Exam Updated Vital Signs BP 137/60   Pulse 76   Temp 99.4 F (37.4 C) (Oral)   Resp 18   Ht 5\' 1"  (1.549 m)   Wt 60.3 kg   SpO2 96%   BMI 25.12 kg/m   Physical Exam Vitals and nursing note reviewed.  Constitutional:      Appearance: She is well-developed.     Comments: Chronically ill-appearing.  In no distress.  Pleasantly confused.  HENT:     Head: Normocephalic and atraumatic.     Right Ear: External ear normal.     Left Ear: External ear normal.     Nose: Nose normal.  Eyes:     General: No scleral icterus.    Conjunctiva/sclera: Conjunctivae normal.  Cardiovascular:     Rate and Rhythm: Normal rate and regular rhythm.     Heart sounds: Normal heart sounds.  Pulmonary:     Effort: Pulmonary effort is normal.     Breath sounds: Normal breath sounds.  Abdominal:     Palpations: Abdomen is soft.     Tenderness: There is no abdominal tenderness.     Comments: No tenderness with palpation, patient states "I do not have a baby in there" during exam.  Genitourinary:    Comments: Incontinent of stool, Hemoccult obtained.  Stool is brown.  No melena. Musculoskeletal:        General: No deformity. Normal range of motion.     Cervical back: Normal range of motion and neck supple.  Skin:    General: Skin is warm and dry.     Capillary Refill: Capillary refill takes less than 2 seconds.     Comments: Postsurgical bandage on her right anterior hip, dry, intact.  No significant surrounding ecchymosis.  Neurological:     Mental Status: She is  alert. Mental status is at baseline. She is disoriented.     ED Results / Procedures / Treatments   Labs (all labs ordered are listed, but only abnormal results are displayed) Labs Reviewed  CBC WITH DIFFERENTIAL/PLATELET - Abnormal; Notable for the following components:      Result Value   RBC 2.44 (*)    Hemoglobin 7.0 (*)    HCT 22.7 (*)    nRBC 1.5 (*)    Abs Immature Granulocytes 0.36 (*)  All other components within normal limits  COMPREHENSIVE METABOLIC PANEL - Abnormal; Notable for the following components:   Potassium 3.1 (*)    Glucose, Bld 148 (*)    BUN 27 (*)    Calcium 8.2 (*)    Total Protein 5.5 (*)    Albumin 2.1 (*)    AST 43 (*)    ALT 57 (*)    All other components within normal limits  RETICULOCYTES - Abnormal; Notable for the following components:   RBC. 2.37 (*)    Immature Retic Fract 31.4 (*)    All other components within normal limits  VITAMIN B12  FOLATE  IRON AND TIBC  FERRITIN  POC OCCULT BLOOD, ED  TYPE AND SCREEN  PREPARE RBC (CROSSMATCH)    EKG None  Radiology No results found.  Procedures .Critical Care Performed by: Kinnie Feil, PA-C Authorized by: Kinnie Feil, PA-C   Critical care provider statement:    Critical care time (minutes):  45   Critical care was necessary to treat or prevent imminent or life-threatening deterioration of the following conditions: severe anemia.   Critical care was time spent personally by me on the following activities:  Discussions with consultants, evaluation of patient's response to treatment, examination of patient, ordering and performing treatments and interventions, ordering and review of laboratory studies, ordering and review of radiographic studies, pulse oximetry, re-evaluation of patient's condition, obtaining history from patient or surrogate, review of old charts and development of treatment plan with patient or surrogate   I assumed direction of critical care for this  patient from another provider in my specialty: no     (including critical care time)  Medications Ordered in ED Medications  0.9 %  sodium chloride infusion (has no administration in time range)    ED Course  I have reviewed the triage vital signs and the nursing notes.  Pertinent labs & imaging results that were available during my care of the patient were reviewed by me and considered in my medical decision making (see chart for details).  Clinical Course as of Jan 23 1906  Mon Jan 24, 2020  1809 Potassium(!): 3.1 [CG]  1809 Hemoglobin(!): 7.0 [CG]    Clinical Course User Index [CG] Arlean Hopping   MDM Rules/Calculators/A&P                          Available medical records reviewed to assist with history and MDM.  I have reviewed paperwork at bedside sent over from [facility] facility.  Physician from living facility documented hemoglobin of 6.8 today.  Limited exam due to advanced dementia however patient does not appear to be in any distress, pleasantly confused.  Unable to determine if patient is symptomatic of anemia given her dementia.  She is not ambulatory at this time.  Stool is brown without melena.  Hemoccult is negative.  Recent hospitalization reviewed.  In summary, patient had normal hemoglobin up until surgery on 6/11.  Her hemoglobin was as low as 7.1 during her admission but improved to 7.6 without any intervention.  Her CODE STATUS was turned to DNR/DNI during the admission.  I have ordered lab work, type and screen.  She is tolerating fluids here.  ER work-up personally reviewed, as above.  She is mildly hypokalemic.  Hemoglobin 7.0, the lowest it has been since surgery.  No other obvious signs of acute bleeding.  We will order 2 units of PRBCs, potassium, here.  We will plan to talk to [place].  Pending hospitalist consult for admission.  Discussed with EDP.  1907: Spoke to POA Briant Sites who is in agreement of current ER plan, transfusion and  admission.  Please call [phone] for updates.   Final Clinical Impression(s) / ED Diagnoses Final diagnoses:  Severe anemia    Rx / DC Orders ED Discharge Orders    None       Arlean Hopping 01/24/20 Judee Clara, MD 01/24/20 2321

## 2020-01-25 DIAGNOSIS — Z8673 Personal history of transient ischemic attack (TIA), and cerebral infarction without residual deficits: Secondary | ICD-10-CM | POA: Diagnosis not present

## 2020-01-25 DIAGNOSIS — D649 Anemia, unspecified: Secondary | ICD-10-CM | POA: Diagnosis present

## 2020-01-25 DIAGNOSIS — F329 Major depressive disorder, single episode, unspecified: Secondary | ICD-10-CM | POA: Diagnosis present

## 2020-01-25 DIAGNOSIS — E876 Hypokalemia: Secondary | ICD-10-CM | POA: Diagnosis present

## 2020-01-25 DIAGNOSIS — Z882 Allergy status to sulfonamides status: Secondary | ICD-10-CM | POA: Diagnosis not present

## 2020-01-25 DIAGNOSIS — I10 Essential (primary) hypertension: Secondary | ICD-10-CM | POA: Diagnosis present

## 2020-01-25 DIAGNOSIS — Z66 Do not resuscitate: Secondary | ICD-10-CM | POA: Diagnosis present

## 2020-01-25 DIAGNOSIS — Z886 Allergy status to analgesic agent status: Secondary | ICD-10-CM | POA: Diagnosis not present

## 2020-01-25 DIAGNOSIS — E8809 Other disorders of plasma-protein metabolism, not elsewhere classified: Secondary | ICD-10-CM | POA: Diagnosis present

## 2020-01-25 DIAGNOSIS — F0391 Unspecified dementia with behavioral disturbance: Secondary | ICD-10-CM | POA: Diagnosis present

## 2020-01-25 DIAGNOSIS — E041 Nontoxic single thyroid nodule: Secondary | ICD-10-CM | POA: Diagnosis present

## 2020-01-25 DIAGNOSIS — Z79899 Other long term (current) drug therapy: Secondary | ICD-10-CM | POA: Diagnosis not present

## 2020-01-25 DIAGNOSIS — Z20822 Contact with and (suspected) exposure to covid-19: Secondary | ICD-10-CM | POA: Diagnosis present

## 2020-01-25 DIAGNOSIS — D62 Acute posthemorrhagic anemia: Secondary | ICD-10-CM | POA: Diagnosis present

## 2020-01-25 DIAGNOSIS — Z96641 Presence of right artificial hip joint: Secondary | ICD-10-CM | POA: Diagnosis present

## 2020-01-25 DIAGNOSIS — E785 Hyperlipidemia, unspecified: Secondary | ICD-10-CM | POA: Diagnosis present

## 2020-01-25 DIAGNOSIS — E7800 Pure hypercholesterolemia, unspecified: Secondary | ICD-10-CM | POA: Diagnosis present

## 2020-01-25 DIAGNOSIS — Z880 Allergy status to penicillin: Secondary | ICD-10-CM | POA: Diagnosis not present

## 2020-01-25 DIAGNOSIS — F05 Delirium due to known physiological condition: Secondary | ICD-10-CM | POA: Diagnosis present

## 2020-01-25 LAB — TYPE AND SCREEN
ABO/RH(D): O NEG
Antibody Screen: NEGATIVE
Unit division: 0
Unit division: 0

## 2020-01-25 LAB — CBC
HCT: 35.1 % — ABNORMAL LOW (ref 36.0–46.0)
HCT: 34.8 % — ABNORMAL LOW (ref 36.0–46.0)
HCT: 32.7 % — ABNORMAL LOW (ref 36.0–46.0)
Hemoglobin: 11.4 g/dL — ABNORMAL LOW (ref 12.0–15.0)
Hemoglobin: 11.7 g/dL — ABNORMAL LOW (ref 12.0–15.0)
Hemoglobin: 10.8 g/dL — ABNORMAL LOW (ref 12.0–15.0)
MCH: 28.8 pg (ref 26.0–34.0)
MCH: 29.2 pg (ref 26.0–34.0)
MCH: 28.7 pg (ref 26.0–34.0)
MCHC: 32.5 g/dL (ref 30.0–36.0)
MCHC: 33.6 g/dL (ref 30.0–36.0)
MCHC: 33 g/dL (ref 30.0–36.0)
MCV: 88.6 fL (ref 80.0–100.0)
MCV: 86.8 fL (ref 80.0–100.0)
MCV: 87 fL (ref 80.0–100.0)
Platelets: 264 10*3/uL (ref 150–400)
Platelets: 294 10*3/uL (ref 150–400)
Platelets: 284 10*3/uL (ref 150–400)
RBC: 3.96 MIL/uL (ref 3.87–5.11)
RBC: 4.01 MIL/uL (ref 3.87–5.11)
RBC: 3.76 MIL/uL — ABNORMAL LOW (ref 3.87–5.11)
RDW: 14 % (ref 11.5–15.5)
RDW: 14.6 % (ref 11.5–15.5)
RDW: 14.5 % (ref 11.5–15.5)
WBC: 7.2 10*3/uL (ref 4.0–10.5)
WBC: 9.8 10*3/uL (ref 4.0–10.5)
WBC: 10.3 10*3/uL (ref 4.0–10.5)
nRBC: 2.1 % — ABNORMAL HIGH (ref 0.0–0.2)
nRBC: 1.2 % — ABNORMAL HIGH (ref 0.0–0.2)
nRBC: 0.7 % — ABNORMAL HIGH (ref 0.0–0.2)

## 2020-01-25 LAB — AMMONIA: Ammonia: 14 umol/L (ref 9–35)

## 2020-01-25 LAB — URINALYSIS, ROUTINE W REFLEX MICROSCOPIC
Bacteria, UA: NONE SEEN
Bilirubin Urine: NEGATIVE
Glucose, UA: NEGATIVE mg/dL
Hgb urine dipstick: NEGATIVE
Ketones, ur: NEGATIVE mg/dL
Nitrite: NEGATIVE
Protein, ur: NEGATIVE mg/dL
Specific Gravity, Urine: 1.012 (ref 1.005–1.030)
pH: 7 (ref 5.0–8.0)

## 2020-01-25 LAB — BPAM RBC
Blood Product Expiration Date: 202107052359
Blood Product Expiration Date: 202107052359
ISSUE DATE / TIME: 202106281926
ISSUE DATE / TIME: 202106282132
Unit Type and Rh: 9500
Unit Type and Rh: 9500

## 2020-01-25 LAB — COMPREHENSIVE METABOLIC PANEL
ALT: 45 U/L — ABNORMAL HIGH (ref 0–44)
AST: 38 U/L (ref 15–41)
Albumin: 1.9 g/dL — ABNORMAL LOW (ref 3.5–5.0)
Alkaline Phosphatase: 52 U/L (ref 38–126)
Anion gap: 8 (ref 5–15)
BUN: 22 mg/dL (ref 8–23)
CO2: 24 mmol/L (ref 22–32)
Calcium: 7.3 mg/dL — ABNORMAL LOW (ref 8.9–10.3)
Chloride: 106 mmol/L (ref 98–111)
Creatinine, Ser: 0.54 mg/dL (ref 0.44–1.00)
GFR calc Af Amer: >60 mL/min (ref >60)
GFR calc non Af Amer: >60 mL/min (ref >60)
Glucose, Bld: 91 mg/dL (ref 70–99)
Potassium: 4.4 mmol/L (ref 3.5–5.1)
Sodium: 138 mmol/L (ref 135–145)
Total Bilirubin: 1.5 mg/dL — ABNORMAL HIGH (ref 0.3–1.2)
Total Protein: 4.9 g/dL — ABNORMAL LOW (ref 6.5–8.1)

## 2020-01-25 LAB — CK: Total CK: 52 U/L (ref 38–234)

## 2020-01-25 LAB — CBC WITH DIFFERENTIAL/PLATELET
Abs Immature Granulocytes: 0.5 10*3/uL — ABNORMAL HIGH (ref 0.00–0.07)
Basophils Absolute: 0 10*3/uL (ref 0.0–0.1)
Basophils Relative: 1 %
Eosinophils Absolute: 0.2 10*3/uL (ref 0.0–0.5)
Eosinophils Relative: 2 %
HCT: 28.5 % — ABNORMAL LOW (ref 36.0–46.0)
Hemoglobin: 9.4 g/dL — ABNORMAL LOW (ref 12.0–15.0)
Immature Granulocytes: 6 %
Lymphocytes Relative: 18 %
Lymphs Abs: 1.4 10*3/uL (ref 0.7–4.0)
MCH: 29.2 pg (ref 26.0–34.0)
MCHC: 33 g/dL (ref 30.0–36.0)
MCV: 88.5 fL (ref 80.0–100.0)
Monocytes Absolute: 0.6 10*3/uL (ref 0.1–1.0)
Monocytes Relative: 7 %
Neutro Abs: 5.2 10*3/uL (ref 1.7–7.7)
Neutrophils Relative %: 66 %
Platelets: 232 10*3/uL (ref 150–400)
RBC: 3.22 MIL/uL — ABNORMAL LOW (ref 3.87–5.11)
RDW: 14.6 % (ref 11.5–15.5)
WBC: 8 10*3/uL (ref 4.0–10.5)
nRBC: 1.4 % — ABNORMAL HIGH (ref 0.0–0.2)

## 2020-01-25 LAB — MAGNESIUM
Magnesium: 1.8 mg/dL (ref 1.7–2.4)
Magnesium: 1.6 mg/dL — ABNORMAL LOW (ref 1.7–2.4)

## 2020-01-25 LAB — PROTIME-INR
INR: 1.1 (ref 0.8–1.2)
Prothrombin Time: 13.6 seconds (ref 11.4–15.2)

## 2020-01-25 LAB — PHOSPHORUS
Phosphorus: 3.8 mg/dL (ref 2.5–4.6)
Phosphorus: 2.5 mg/dL (ref 2.5–4.6)

## 2020-01-25 LAB — TSH: TSH: 2.184 u[IU]/mL (ref 0.350–4.500)

## 2020-01-25 LAB — HEPATITIS PANEL, ACUTE
HCV Ab: NONREACTIVE
Hep A IgM: NONREACTIVE
Hep B C IgM: NONREACTIVE
Hepatitis B Surface Ag: NONREACTIVE

## 2020-01-25 LAB — PREALBUMIN: Prealbumin: 13.8 mg/dL — ABNORMAL LOW (ref 18–38)

## 2020-01-25 LAB — VALPROIC ACID LEVEL: Valproic Acid Lvl: 16 ug/mL — ABNORMAL LOW (ref 50.0–100.0)

## 2020-01-25 LAB — LACTIC ACID, PLASMA: Lactic Acid, Venous: 0.9 mmol/L (ref 0.5–1.9)

## 2020-01-25 MED ORDER — HYDROCODONE-ACETAMINOPHEN 5-325 MG PO TABS
1.0000 | ORAL_TABLET | ORAL | Status: DC | PRN
  Administered 2020-01-29: 1 via ORAL
  Filled 2020-01-25: qty 1

## 2020-01-25 MED ORDER — MIRTAZAPINE 15 MG PO TABS
7.5000 mg | ORAL_TABLET | Freq: Every day | ORAL | Status: DC
  Administered 2020-01-25 – 2020-01-30 (×6): 7.5 mg via ORAL
  Filled 2020-01-25 (×8): qty 1

## 2020-01-25 MED ORDER — MAGNESIUM SULFATE 2 GM/50ML IV SOLN
2.0000 g | Freq: Once | INTRAVENOUS | Status: AC
  Administered 2020-01-25: 2 g via INTRAVENOUS
  Filled 2020-01-25: qty 50

## 2020-01-25 MED ORDER — METOPROLOL SUCCINATE ER 25 MG PO TB24
25.0000 mg | ORAL_TABLET | Freq: Every day | ORAL | Status: DC
  Administered 2020-01-25 – 2020-01-31 (×7): 25 mg via ORAL
  Filled 2020-01-25 (×8): qty 1

## 2020-01-25 MED ORDER — DARIFENACIN HYDROBROMIDE ER 7.5 MG PO TB24
7.5000 mg | ORAL_TABLET | Freq: Every day | ORAL | Status: DC
  Administered 2020-01-26 – 2020-01-31 (×6): 7.5 mg via ORAL
  Filled 2020-01-25 (×7): qty 1

## 2020-01-25 MED ORDER — SODIUM CHLORIDE 0.9 % IV SOLN: INTRAVENOUS | Status: DC

## 2020-01-25 MED ORDER — SODIUM CHLORIDE 0.9 % IV SOLN
510.0000 mg | Freq: Once | INTRAVENOUS | Status: AC
  Administered 2020-01-25: 510 mg via INTRAVENOUS
  Filled 2020-01-25 (×2): qty 17

## 2020-01-25 MED ORDER — SODIUM CHLORIDE 0.9% FLUSH
3.0000 mL | Freq: Two times a day (BID) | INTRAVENOUS | Status: DC
  Administered 2020-01-25 – 2020-01-31 (×13): 3 mL via INTRAVENOUS

## 2020-01-25 MED ORDER — DOCUSATE SODIUM 100 MG PO CAPS
100.0000 mg | ORAL_CAPSULE | Freq: Two times a day (BID) | ORAL | Status: DC
  Administered 2020-01-26 – 2020-01-31 (×11): 100 mg via ORAL
  Filled 2020-01-25 (×12): qty 1

## 2020-01-25 MED ORDER — DONEPEZIL HCL 10 MG PO TABS
10.0000 mg | ORAL_TABLET | Freq: Every day | ORAL | Status: DC
  Administered 2020-01-25 – 2020-01-30 (×6): 10 mg via ORAL
  Filled 2020-01-25 (×7): qty 1

## 2020-01-25 MED ORDER — BISACODYL 10 MG RE SUPP: 10.0000 mg | Freq: Every day | RECTAL | Status: DC | PRN

## 2020-01-25 MED ORDER — DIVALPROEX SODIUM 250 MG PO DR TAB
250.0000 mg | DELAYED_RELEASE_TABLET | Freq: Two times a day (BID) | ORAL | Status: DC
  Administered 2020-01-25 – 2020-01-31 (×12): 250 mg via ORAL
  Filled 2020-01-25 (×15): qty 1

## 2020-01-25 MED ORDER — ACETAMINOPHEN 650 MG RE SUPP: 650.0000 mg | Freq: Four times a day (QID) | RECTAL | Status: DC | PRN

## 2020-01-25 MED ORDER — ESCITALOPRAM OXALATE 10 MG PO TABS
10.0000 mg | ORAL_TABLET | Freq: Every day | ORAL | Status: DC
  Administered 2020-01-25 – 2020-01-31 (×7): 10 mg via ORAL
  Filled 2020-01-25 (×8): qty 1

## 2020-01-25 MED ORDER — ACETAMINOPHEN 325 MG PO TABS
650.0000 mg | ORAL_TABLET | Freq: Four times a day (QID) | ORAL | Status: DC | PRN
  Administered 2020-01-26: 650 mg via ORAL
  Filled 2020-01-25: qty 2

## 2020-01-25 MED ORDER — ENOXAPARIN SODIUM 40 MG/0.4ML ~~LOC~~ SOLN
40.0000 mg | SUBCUTANEOUS | Status: DC
  Administered 2020-01-25 – 2020-01-31 (×7): 40 mg via SUBCUTANEOUS
  Filled 2020-01-25 (×5): qty 0.4

## 2020-01-25 MED ORDER — ATORVASTATIN CALCIUM 40 MG PO TABS
40.0000 mg | ORAL_TABLET | Freq: Every day | ORAL | Status: DC
  Administered 2020-01-25 – 2020-01-30 (×7): 40 mg via ORAL
  Filled 2020-01-25 (×7): qty 1

## 2020-01-25 MED ORDER — GUAIFENESIN 100 MG/5ML PO SOLN
200.0000 mg | Freq: Four times a day (QID) | ORAL | Status: DC | PRN
  Filled 2020-01-25: qty 10

## 2020-01-25 NOTE — Evaluation (Signed)
Clinical/Bedside Swallow Evaluation Patient Details  Name: Janet [name] MRN: [id number] Date of Birth: [birth year]  Today's Date: 01/25/2020 Time: SLP Start Time (ACUTE ONLY): 20 SLP Stop Time (ACUTE ONLY): 0929 SLP Time Calculation (min) (ACUTE ONLY): 9 min  Past Medical History:  Past Medical History:  Diagnosis Date   Cancer ([place])    High cholesterol    Neuralgia facialis vera    TIA (transient ischemic attack)    Past Surgical History:  Past Surgical History:  Procedure Laterality Date   ANTERIOR APPROACH HEMI HIP ARTHROPLASTY Right 01/15/2020   Procedure: ANTERIOR APPROACH HEMI HIP ARTHROPLASTY;  Surgeon: Leandrew Koyanagi, MD;  Location: [facility];  Service: Orthopedics;  Laterality: Right;   HPI:  Janet [name] is a 85 y.o. female with pertinent past medical history of advanced dementia, recent mechanical fall leading to right femoral neck fracture status post hemiarthroplasty by Dr. Erlinda Hong on 8/41 complicated by postoperative blood loss anemia presents to the ED for evaluation of low hemoglobin.  Her hemoglobin was 6.8 earlier this morning and she was transferred for "severe anemia".  No current imaging.   Assessment / Plan / Recommendation Clinical Impression  Pt presents with mild oral dysphagia 2/2 edentulism.  Pt states that she wears dentures, but they could not be located in room.  Pt tolerated all consistencies trialed without any overt s/s of aspiration.  There was mild anterior spillage of thin liquid with spoon presentation.  Pt was able to siphon from straw and contain bolus without difficulty.  With regular solid there was adequate oral clearance after prolonged oral phase.  With soft solid oral phase was more efficient.  No further ST needed at this time.  If dentures are located and pt wishes to advance to regular texture diet, please reconsult SLP.  Recommend mechanical soft diet with thin liquid.  SLP Visit Diagnosis: Dysphagia, oral phase (R13.11)    Aspiration  Risk  Mild aspiration risk    Diet Recommendation Dysphagia 3 (Mech soft);Thin liquid   Liquid Administration via: Cup;Straw Medication Administration: Whole meds with liquid (as tolerated) Supervision: Staff to assist with self feeding Compensations: Slow rate;Small sips/bites Postural Changes: Seated upright at 90 degrees    Other  Recommendations Oral Care Recommendations: Oral care BID   Follow up Recommendations None      Frequency and Duration  (N/A)          Prognosis   N/A     Swallow Study   General Date of Onset: 01/24/20 HPI: Janet [name] is a 85 y.o. female with pertinent past medical history of advanced dementia, recent mechanical fall leading to right femoral neck fracture status post hemiarthroplasty by Dr. Erlinda Hong on 3/24 complicated by postoperative blood loss anemia presents to the ED for evaluation of low hemoglobin.  Her hemoglobin was 6.8 earlier this morning and she was transferred for "severe anemia".  No current imaging. Type of Study: Bedside Swallow Evaluation Previous Swallow Assessment: None Diet Prior to this Study: NPO Temperature Spikes Noted: No Respiratory Status: Room air History of Recent Intubation: No Behavior/Cognition: Alert;Cooperative;Pleasant mood;Confused Oral Cavity Assessment: Within Functional Limits Oral Care Completed by SLP: No Oral Cavity - Dentition: Edentulous;Dentures, not available Self-Feeding Abilities: Needs assist Patient Positioning: Upright in bed Baseline Vocal Quality: Normal Volitional Cough: Strong Volitional Swallow: Unable to elicit    Oral/Motor/Sensory Function Overall Oral Motor/Sensory Function: Within functional limits (Lingual tremor noted) Facial ROM: Within Functional Limits Facial Symmetry: Within Functional Limits Lingual ROM: Within Functional Limits Lingual Symmetry:  Within Functional Limits Lingual Strength: Within Functional Limits Velum: Within Functional Limits Mandible: Within Functional  Limits   Ice Chips Ice chips: Not tested   Thin Liquid Thin Liquid: Within functional limits Presentation: Spoon;Straw Oral Phase Functional Implications: Right anterior spillage (with spoon presentation only)    Nectar Thick Nectar Thick Liquid: Not tested   Honey Thick Honey Thick Liquid: Not tested   Puree Puree: Within functional limits Presentation: Spoon   Solid     Solid: Impaired Presentation: Spoon (SLP fed) Oral Phase Functional Implications: Prolonged oral transit;Impaired mastication      Janet [name], [place], [place] Office: [phone] 01/25/2020,10:18 AM
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
## 2020-01-25 NOTE — Evaluation (Signed)
Physical Therapy Evaluation Patient Details Name: Janet [name] MRN: [id number] DOB: 1935/05/17 Today's Date: 01/25/2020   History of Present Illness  Pt is an 85 y/o female presenting from SNF secondary to low Hgb. Pt with recent fall and is s/p R hip himarthroplasty, direct anterior. PMH includes dementia, covid, and TIA.   Clinical Impression  Pt admitted secondary to problem above with deficits below. Pt requiring total A +2 for assist with rolling for clean up following BM. Pt resistive to movement and kept repeating "don't hurt me". Unsure of baseline, however, pt from SNF. Recommend return to SNF at d/c. Will continue to follow acutely to maximize functional mobility independence and safety.     Follow Up Recommendations SNF    Equipment Recommendations  None recommended by PT    Recommendations for Other Services       Precautions / Restrictions Precautions Precautions: Fall Restrictions Weight Bearing Restrictions: Yes RLE Weight Bearing: Weight bearing as tolerated      Mobility  Bed Mobility Overal bed mobility: Needs Assistance Bed Mobility: Rolling Rolling: Total assist         General bed mobility comments: Total A +2 for assist with rolling from side to side for clean up. Pt yelling out in pain and asking PT/OT not to hurt her. Multimodal cues for sequencing.   Transfers                    Ambulation/Gait                Stairs            Wheelchair Mobility    Modified Rankin (Stroke Patients Only)       Balance                                             Pertinent Vitals/Pain Pain Assessment: Faces Faces Pain Scale: Hurts little more Pain Location: generalized; RLE, bottom Pain Descriptors / Indicators: Grimacing;Guarding Pain Intervention(s): Monitored during session;Limited activity within patient's tolerance;Repositioned    Home Living Family/patient expects to be discharged to:: Skilled nursing  facility                      Prior Function           Comments: Unsure of baseline since falling. Assume she required assist for all transfers and ADL tasks.      Hand Dominance        Extremity/Trunk Assessment   Upper Extremity Assessment Upper Extremity Assessment: Defer to OT evaluation    Lower Extremity Assessment Lower Extremity Assessment: Generalized weakness;RLE deficits/detail RLE Deficits / Details: Recent R hip surgery. Very limited in ROM and kept very stiff throughout.        Communication   Communication: No difficulties  Cognition Arousal/Alertness: Awake/alert Behavior During Therapy: Anxious Overall Cognitive Status: History of cognitive impairments - at baseline                                 General Comments: Dementia at baseline; kept yelling "don't hurt me". Ensured pt we were trying to help clean her up and not hurt her.       General Comments      Exercises     Assessment/Plan    PT Assessment  Patient needs continued PT services  PT Problem List Decreased strength;Decreased range of motion;Decreased activity tolerance;Decreased mobility;Decreased balance;Decreased cognition;Decreased safety awareness;Pain;Decreased knowledge of use of DME;Decreased knowledge of precautions       PT Treatment Interventions DME instruction;Gait training;Functional mobility training;Balance training;Therapeutic activities;Therapeutic exercise;Patient/family education;Wheelchair mobility training    PT Goals (Current goals can be found in the Care Plan section)  Acute Rehab PT Goals PT Goal Formulation: Patient unable to participate in goal setting Time For Goal Achievement: 02/08/20 Potential to Achieve Goals: Fair    Frequency Min 2X/week   Barriers to discharge        Co-evaluation PT/OT/SLP Co-Evaluation/Treatment: Yes Reason for Co-Treatment: Necessary to address cognition/behavior during functional activity;For  patient/therapist safety PT goals addressed during session: Mobility/safety with mobility         AM-PAC PT "6 Clicks" Mobility  Outcome Measure Help needed turning from your back to your side while in a flat bed without using bedrails?: Total Help needed moving from lying on your back to sitting on the side of a flat bed without using bedrails?: Total Help needed moving to and from a bed to a chair (including a wheelchair)?: Total Help needed standing up from a chair using your arms (e.g., wheelchair or bedside chair)?: Total Help needed to walk in hospital room?: Total Help needed climbing 3-5 steps with a railing? : Total 6 Click Score: 6    End of Session   Activity Tolerance: Patient limited by pain Patient left: in bed;with call bell/phone within reach (on stretcher in ED ) Nurse Communication: Mobility status;Other (comment) (found pill in pts bed ) PT Visit Diagnosis: Difficulty in walking, not elsewhere classified (R26.2);Other abnormalities of gait and mobility (R26.89)    Time: 6701-4103 PT Time Calculation (min) (ACUTE ONLY): 18 min   Charges:   PT Evaluation $PT Eval Moderate Complexity: 1 Mod          Reuel Derby, PT, DPT  Acute Rehabilitation Services  Pager: [phone] Office: [phone]   Rudean Hitt 01/25/2020, 2:26 PM

## 2020-01-25 NOTE — ED Notes (Signed)
Pt passed swallow screen per Speech Therapy

## 2020-01-25 NOTE — Evaluation (Signed)
Occupational Therapy Evaluation Patient Details Name: Janet [name] MRN: [id number] DOB: 1934/09/24 Today's Date: 01/25/2020    History of Present Illness Pt is an 85 y/o female presenting from SNF secondary to low Hgb. Pt with recent fall and is s/p R hip himarthroplasty, direct anterior. PMH includes dementia, covid, and TIA.    Clinical Impression   PT admitted with low Hbg. Pt currently with functional limitiations due to the deficits listed below (see OT problem list). Pt currently total (A) for all adls. Pt able to verbalize void but unaware that it had already occurred. Pt with decrease attention to L side and decreased L cervical neck rotation. Pt with R neck positioning naturally.  Pt will benefit from skilled OT to increase their independence and safety with adls and balance to allow discharge SNF.     Follow Up Recommendations  SNF    Equipment Recommendations  Wheelchair cushion (measurements OT);Wheelchair (measurements OT);Hospital bed    Recommendations for Other Services       Precautions / Restrictions Precautions Precautions: Fall Precaution Booklet Issued: No Precaution Comments: direct anterior hip Restrictions Weight Bearing Restrictions: Yes RLE Weight Bearing: Weight bearing as tolerated      Mobility Bed Mobility Overal bed mobility: Needs Assistance Bed Mobility: Rolling Rolling: Total assist         General bed mobility comments: Total A +2 for assist with rolling from side to side for clean up. Pt yelling out in pain and asking PT/OT not to hurt her. Multimodal cues for sequencing.   Transfers                 General transfer comment: deferred    Balance Overall balance assessment: Needs assistance   Sitting balance-Leahy Scale: Poor Sitting balance - Comments: Not observed, infered     Standing balance-Leahy Scale: Zero Standing balance comment: Not observed, infered                           ADL either performed  or assessed with clinical judgement   ADL Overall ADL's : Needs assistance/impaired                                       General ADL Comments: total (A) for all adls at this time. pt reports feeling that she needs to void bowels and already voiding in brief. Brief noted to ahve pressure in peri area so removed and only bed linen in place at this time     Vision   Additional Comments: decrease visual attention to the Left side and decrease L cervical neck rotation     Perception     Praxis      Pertinent Vitals/Pain Pain Assessment: Faces Faces Pain Scale: Hurts little more Pain Location: generalized; RLE, bottom Pain Descriptors / Indicators: Grimacing;Guarding Pain Intervention(s): Monitored during session;Premedicated before session;Repositioned     Hand Dominance Right   Extremity/Trunk Assessment Upper Extremity Assessment Upper Extremity Assessment: Generalized weakness   Lower Extremity Assessment Lower Extremity Assessment: Defer to PT evaluation RLE Deficits / Details: Recent R hip surgery. Very limited in ROM and kept very stiff throughout.    Cervical / Trunk Assessment Cervical / Trunk Assessment: Kyphotic   Communication Communication Communication: No difficulties   Cognition Arousal/Alertness: Awake/alert Behavior During Therapy: Anxious Overall Cognitive Status: History of cognitive impairments - at baseline  General Comments: Dementia at baseline; kept yelling "don't hurt me". Ensured pt we were trying to help clean her up and not hurt her.    General Comments  pt noted to have pressure area from adult brief. pt without brief after peri care this session to decrease skin break down. pt provided barrier cream    Exercises     Shoulder Instructions      Home Living Family/patient expects to be discharged to:: Skilled nursing facility                                         Prior Functioning/Environment Level of Independence: Needs assistance        Comments: pt with recent admit to SNF from previous ALF [facility]        OT Problem List: Decreased strength;Decreased activity tolerance;Impaired balance (sitting and/or standing);Decreased cognition;Decreased safety awareness;Decreased knowledge of precautions;Decreased knowledge of use of DME or AE;Pain      OT Treatment/Interventions: Self-care/ADL training;Therapeutic exercise;DME and/or AE instruction;Therapeutic activities;Cognitive remediation/compensation;Patient/family education;Balance training;Modalities    OT Goals(Current goals can be found in the care plan section) Acute Rehab OT Goals Patient Stated Goal: unable OT Goal Formulation: Patient unable to participate in goal setting Time For Goal Achievement: 02/08/20 Potential to Achieve Goals: Poor  OT Frequency: Min 2X/week   Barriers to D/C: Decreased caregiver support          Co-evaluation PT/OT/SLP Co-Evaluation/Treatment: Yes Reason for Co-Treatment: Necessary to address cognition/behavior during functional activity;For patient/therapist safety;To address functional/ADL transfers PT goals addressed during session: Mobility/safety with mobility OT goals addressed during session: ADL's and self-care;Proper use of Adaptive equipment and DME;Strengthening/ROM      AM-PAC OT "6 Clicks" Daily Activity     Outcome Measure Help from another person eating meals?: Total Help from another person taking care of personal grooming?: Total Help from another person toileting, which includes using toliet, bedpan, or urinal?: Total Help from another person bathing (including washing, rinsing, drying)?: Total Help from another person to put on and taking off regular upper body clothing?: Total Help from another person to put on and taking off regular lower body clothing?: Total 6 Click Score: 6   End of Session Nurse Communication:  Mobility status;Precautions (L hand edema)  Activity Tolerance: Patient tolerated treatment well Patient left: in bed;with call bell/phone within reach (L forearm and hand elevated on pillow, small ball in hand)  OT Visit Diagnosis: Unsteadiness on feet (R26.81);Muscle weakness (generalized) (M62.81);Other abnormalities of gait and mobility (R26.89);History of falling (Z91.81);Other symptoms and signs involving cognitive function;Pain                Time: 1135-1153 OT Time Calculation (min): 18 min Charges:  OT General Charges $OT Visit: 1 Visit OT Evaluation $OT Eval Moderate Complexity: 1 Mod   Brynn, OTR/L  Acute Rehabilitation Services Pager: [phone] Office: [phone] .   Jeri Modena 01/25/2020, 3:10 PM

## 2020-01-25 NOTE — ED Notes (Signed)
Report given to 5M RN. All questions answered 

## 2020-01-25 NOTE — ED Notes (Signed)
Speech therapy evaluating at bedside

## 2020-01-26 LAB — CBC
HCT: 31.3 % — ABNORMAL LOW (ref 36.0–46.0)
HCT: 35.2 % — ABNORMAL LOW (ref 36.0–46.0)
Hemoglobin: 10.3 g/dL — ABNORMAL LOW (ref 12.0–15.0)
Hemoglobin: 12.2 g/dL (ref 12.0–15.0)
MCH: 28.5 pg (ref 26.0–34.0)
MCH: 29.9 pg (ref 26.0–34.0)
MCHC: 32.9 g/dL (ref 30.0–36.0)
MCHC: 34.7 g/dL (ref 30.0–36.0)
MCV: 86.3 fL (ref 80.0–100.0)
MCV: 86.7 fL (ref 80.0–100.0)
Platelets: 232 10*3/uL (ref 150–400)
Platelets: 254 10*3/uL (ref 150–400)
RBC: 3.61 MIL/uL — ABNORMAL LOW (ref 3.87–5.11)
RBC: 4.08 MIL/uL (ref 3.87–5.11)
RDW: 14.1 % (ref 11.5–15.5)
RDW: 14.2 % (ref 11.5–15.5)
WBC: 9.4 10*3/uL (ref 4.0–10.5)
WBC: 9.6 10*3/uL (ref 4.0–10.5)
nRBC: 0.7 % — ABNORMAL HIGH (ref 0.0–0.2)
nRBC: 0.7 % — ABNORMAL HIGH (ref 0.0–0.2)

## 2020-01-26 MED ORDER — ENSURE ENLIVE PO LIQD
237.0000 mL | Freq: Three times a day (TID) | ORAL | Status: DC
  Administered 2020-01-26 – 2020-01-31 (×13): 237 mL via ORAL

## 2020-01-26 MED ORDER — ADULT MULTIVITAMIN W/MINERALS CH
1.0000 | ORAL_TABLET | Freq: Every day | ORAL | Status: DC
  Administered 2020-01-26 – 2020-01-31 (×6): 1 via ORAL
  Filled 2020-01-26 (×5): qty 1

## 2020-01-26 NOTE — Plan of Care (Signed)
°  Problem: Coping: °Goal: Level of anxiety will decrease °Outcome: Progressing °  °

## 2020-01-26 NOTE — Progress Notes (Signed)
Initial Nutrition Assessment  DOCUMENTATION CODES:   Not applicable  INTERVENTION:  Ensure Enlive po TID, each supplement provides 350 kcal and 20 grams of protein  MVI daily  NUTRITION DIAGNOSIS:   Increased nutrient needs related to post-op healing as evidenced by estimated needs.    GOAL:   Patient will meet greater than or equal to 90% of their needs    MONITOR:   PO intake, Supplement acceptance, Labs, I & O's, Weight trends, Skin  REASON FOR ASSESSMENT:   Consult Assessment of nutrition requirement/status, Malnutrition Eval  ASSESSMENT:   Pt admitted for symptomatic anemia. PMH includes advanced dementia, recent R femoral neck fx s/p hemiarthroplasty, hx of TIA, hard of hearing, hx Covid-19 (Dec 2020).  Pt unavailable for RD visit.   Per H&P, pt unable to provide much hx.  No PO intake documented.   Limited wt hx available for review.   Labs reviewed.  Medications: Colace, Remeron, Protonix  NUTRITION - FOCUSED PHYSICAL EXAM:  Unable to perform at this time, will attempt at follow-up.   Diet Order:   Diet Order            DIET DYS 3 Room service appropriate? Yes; Fluid consistency: Thin  Diet effective now                 EDUCATION NEEDS:   No education needs have been identified at this time  Skin:  Skin Assessment: Skin Integrity Issues: Skin Integrity Issues:: Incisions Incisions: R hip  Last BM:  6/29  Height:   Ht Readings from Last 1 Encounters:  01/24/20 5\' 1"  (1.549 m)    Weight:   Wt Readings from Last 1 Encounters:  01/24/20 60.3 kg    BMI:  Body mass index is 25.12 kg/m.  Estimated Nutritional Needs:   Kcal:  1500-1700  Protein:  75-85 grams  Fluid:  >1.5L/d    Larkin Ina, MS, RD, LDN RD pager number and weekend/on-call pager number located in [place].
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
## 2020-01-27 NOTE — Plan of Care (Signed)
  Problem: Safety: Goal: Ability to remain free from injury will improve Outcome: Progressing   
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
## 2020-01-28 LAB — MRSA PCR SCREENING: MRSA by PCR: NEGATIVE
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
## 2020-01-28 NOTE — Plan of Care (Signed)
  Problem: Safety: Goal: Ability to remain free from injury will improve Outcome: Progressing   

## 2020-01-28 NOTE — Progress Notes (Signed)
Physical Therapy Treatment Patient Details Name: Janet [name] MRN: [id number] DOB: 1934/08/22 Today's Date: 01/28/2020    History of Present Illness Pt is an 85 y/o female presenting from SNF secondary to low Hgb. Pt with recent fall and is s/p R hip himarthroplasty, direct anterior. PMH includes dementia, covid, and TIA.     PT Comments    Pt with slow progression towards goals. Very resistive to attempts at basic bed mobility. Required total A to roll for removal of soiled bed pad. PT performed PROM of L hip this session, and pt tolerated well. Current recommendations appropriate. Will continue to follow acutely.    Follow Up Recommendations  SNF     Equipment Recommendations  None recommended by PT    Recommendations for Other Services       Precautions / Restrictions Precautions Precautions: Fall Restrictions Weight Bearing Restrictions: Yes RLE Weight Bearing: Weight bearing as tolerated    Mobility  Bed Mobility Overal bed mobility: Needs Assistance Bed Mobility: Rolling Rolling: Total assist         General bed mobility comments: Total A to roll to removed soiled pad. Pt very resistive. Would not sit at EOB despite multiple attempts.   Transfers                    Ambulation/Gait                 Stairs             Wheelchair Mobility    Modified Rankin (Stroke Patients Only)       Balance                                            Cognition Arousal/Alertness: Awake/alert Behavior During Therapy: Anxious;Restless Overall Cognitive Status: History of cognitive impairments - at baseline Area of Impairment: Memory;Following commands;Safety/judgement;Awareness;Problem solving                     Memory: Decreased short-term memory;Decreased recall of precautions Following Commands: Follows one step commands inconsistently;Follows one step commands with increased time Safety/Judgement: Decreased  awareness of safety;Decreased awareness of deficits Awareness: Intellectual Problem Solving: Requires verbal cues General Comments: Dementia at baseline. Pt would be conversing with PT and then state "I'm going to sleep." Resistant at end of therapy session, even when PT had to remove wet bed pad from under pt.       Exercises General Exercises - Lower Extremity Ankle Circles/Pumps: AROM;Both;10 reps Heel Slides: PROM;Left;10 reps;Supine Hip ABduction/ADduction: PROM;Left;10 reps;Supine    General Comments        Pertinent Vitals/Pain Pain Assessment: Faces Faces Pain Scale: Hurts little more Pain Location: generalized; RLE, bottom Pain Descriptors / Indicators: Grimacing;Guarding Pain Intervention(s): Limited activity within patient's tolerance;Monitored during session;Repositioned    Home Living                      Prior Function            PT Goals (current goals can now be found in the care plan section) Acute Rehab PT Goals Patient Stated Goal: unable PT Goal Formulation: Patient unable to participate in goal setting Time For Goal Achievement: 02/08/20 Potential to Achieve Goals: Fair Progress towards PT goals: Progressing toward goals (slowly )    Frequency    Min 2X/week  PT Plan Current plan remains appropriate    Co-evaluation              AM-PAC PT "6 Clicks" Mobility   Outcome Measure  Help needed turning from your back to your side while in a flat bed without using bedrails?: Total Help needed moving from lying on your back to sitting on the side of a flat bed without using bedrails?: Total Help needed moving to and from a bed to a chair (including a wheelchair)?: Total Help needed standing up from a chair using your arms (e.g., wheelchair or bedside chair)?: Total Help needed to walk in hospital room?: Total Help needed climbing 3-5 steps with a railing? : Total 6 Click Score: 6    End of Session   Activity Tolerance:  Patient limited by pain Patient left: in bed;with call bell/phone within reach;with bed alarm set Nurse Communication: Mobility status PT Visit Diagnosis: Difficulty in walking, not elsewhere classified (R26.2);Other abnormalities of gait and mobility (R26.89) Pain - Right/Left: Right Pain - part of body: Hip     Time: 5320-2334 PT Time Calculation (min) (ACUTE ONLY): 13 min  Charges:  $Therapeutic Activity: 8-22 mins                     Lou Miner, DPT  Acute Rehabilitation Services  Pager: [phone] Office: [phone]    Rudean Hitt 01/28/2020, 11:01 AM

## 2020-01-29 NOTE — TOC Progression Note (Signed)
Transition of Care [facility]) - Progression Note    Patient Details  Name: Janet [name] MRN: [id number] Date of Birth: 07-09-1935  Transition of Care [facility]) CM/SW [place], [place] Phone Number: 01/29/2020, 12:08 PM  Clinical Narrative:    CSW received insurance authorization for patient, good 6/29 thru 7/6. Navi id [id number]. Pasrr [phone].  Expected Discharge Plan: Skilled Nursing Facility Barriers to Discharge: Other (comment) (Needs SNF bed and insurance authorization)  Expected Discharge Plan and Services Expected Discharge Plan: [place] arrangements for the past 2 months: [place] (From [place])                                       Social Determinants of Health (SDOH) Interventions    Readmission Risk Interventions No flowsheet data found.

## 2020-01-29 NOTE — [place] FL2 (Signed)
[place] MEDICAID FL2 LEVEL OF CARE SCREENING TOOL     IDENTIFICATION  Patient Name: Janet [name] Birthdate: 1935-01-30 Sex: female Admission Date (Current Location): 01/24/2020  [facility] [place] Number:  Herbalist and Address:  [facility], [place] [address], [place], [place] [address]      Provider Number: [id number]  Attending Physician Name and Address:  Lavina Hamman, MD  Relative Name and Phone Number:  Kaylyn Layer    Current Level of Care: Hospital Recommended Level of Care: [place] Prior Approval Number:    Date Approved/Denied:   PASRR Number: [id number]  Discharge Plan: SNF    Current Diagnoses: Patient Active Problem List   Diagnosis Date Noted  . Anemia 01/25/2020  . Hypokalemia 01/24/2020  . Symptomatic anemia 01/24/2020  . Elevated LFTs 01/24/2020  . Hypoalbuminemia 01/24/2020  . Prolonged QT interval 01/24/2020  . Acute blood loss as cause of postoperative anemia 01/21/2020  . Closed displaced fracture of right femoral neck ([place]) 01/14/2020  . Thyroid nodule 01/14/2020  . Thrombocytopenia ([place]) 01/14/2020  . Dementia ([place]) 01/14/2020  . TIA (transient ischemic attack)   . High cholesterol   . Neuralgia facialis vera     Orientation RESPIRATION BLADDER Height & Weight     Self  Normal Incontinent, External catheter Weight: 122 lb (55.3 kg) Height:  5\' 1"  (154.9 cm)  BEHAVIORAL SYMPTOMS/MOOD NEUROLOGICAL BOWEL NUTRITION STATUS      Continent Diet (See discharge summary)  AMBULATORY STATUS COMMUNICATION OF NEEDS Skin   Total Care Verbally Normal                       Personal Care Assistance Level of Assistance  Bathing, Feeding, Dressing Bathing Assistance: Maximum assistance Feeding assistance: Maximum assistance Dressing Assistance: Maximum assistance     Functional Limitations Info             SPECIAL CARE FACTORS FREQUENCY  PT (By licensed PT)     PT Frequency:  5x a week OT Frequency: 5x a week            Contractures Contractures Info: Not present    Additional Factors Info  Code Status, Allergies Code Status Info: DNR Allergies Info: Aspirin; Penicillins; Sulfa Antibiotics; Sulfamethoprim (sulfamethoxazole-trimethoprim)           Current Medications (01/29/2020):  This is the current hospital active medication list Current Facility-Administered Medications  Medication Dose Route Frequency Provider Last Rate Last Admin  . acetaminophen (TYLENOL) tablet 650 mg  650 mg Oral Q6H PRN Toy Baker, MD   650 mg at 01/26/20 1234   Or  . acetaminophen (TYLENOL) suppository 650 mg  650 mg Rectal Q6H PRN Doutova, Anastassia, MD      . atorvastatin (LIPITOR) tablet 40 mg  40 mg Oral QHS Doutova, Anastassia, MD   40 mg at 01/28/20 2151  . bisacodyl (DULCOLAX) suppository 10 mg  10 mg Rectal Daily PRN Toy Baker, MD      . darifenacin (ENABLEX) 24 hr tablet 7.5 mg  7.5 mg Oral Daily Doutova, Anastassia, MD   7.5 mg at 01/29/20 1031  . divalproex (DEPAKOTE) DR tablet 250 mg  250 mg Oral BID WC Doutova, Anastassia, MD   250 mg at 01/29/20 1031  . docusate sodium (COLACE) capsule 100 mg  100 mg Oral BID Doutova, Anastassia, MD   100 mg at 01/29/20 1031  . donepezil (ARICEPT) tablet 10 mg  10 mg  Oral QHS Toy Baker, MD   10 mg at 01/28/20 2151  . enoxaparin (LOVENOX) injection 40 mg  40 mg Subcutaneous Q24H Lavina Hamman, MD   40 mg at 01/28/20 1148  . escitalopram (LEXAPRO) tablet 10 mg  10 mg Oral Daily Doutova, Anastassia, MD   10 mg at 01/29/20 1031  . feeding supplement (ENSURE ENLIVE) (ENSURE ENLIVE) liquid 237 mL  237 mL Oral TID BM Lavina Hamman, MD   237 mL at 01/29/20 1030  . guaiFENesin (ROBITUSSIN) 100 MG/5ML solution 200 mg  200 mg Oral QID PRN Toy Baker, MD      . HYDROcodone-acetaminophen (NORCO/VICODIN) 5-325 MG per tablet 1-2 tablet  1-2 tablet Oral Q4H PRN Toy Baker, MD   1 tablet at  01/29/20 0206  . metoprolol succinate (TOPROL-XL) 24 hr tablet 25 mg  25 mg Oral Daily Doutova, Anastassia, MD   25 mg at 01/29/20 1030  . mirtazapine (REMERON) tablet 7.5 mg  7.5 mg Oral QHS Doutova, Anastassia, MD   7.5 mg at 01/28/20 2151  . multivitamin with minerals tablet 1 tablet  1 tablet Oral Daily Lavina Hamman, MD   1 tablet at 01/29/20 1031  . pantoprazole (PROTONIX) EC tablet 40 mg  40 mg Oral BID Toy Baker, MD   40 mg at 01/29/20 1031  . sodium chloride flush (NS) 0.9 % injection 3 mL  3 mL Intravenous Q12H Doutova, Anastassia, MD   3 mL at 01/29/20 1038     Discharge Medications: Please see discharge summary for a list of discharge medications.  Relevant Imaging Results:  Relevant Lab Results:   Additional Information ssn [id number]  Neysa Hotter [place], [place]
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
## 2020-01-30 LAB — CBC WITH DIFFERENTIAL/PLATELET
Abs Immature Granulocytes: 0.12 10*3/uL — ABNORMAL HIGH (ref 0.00–0.07)
Basophils Absolute: 0 10*3/uL (ref 0.0–0.1)
Basophils Relative: 1 %
Eosinophils Absolute: 0.3 10*3/uL (ref 0.0–0.5)
Eosinophils Relative: 3 %
HCT: 38.5 % (ref 36.0–46.0)
Hemoglobin: 12.3 g/dL (ref 12.0–15.0)
Immature Granulocytes: 1 %
Lymphocytes Relative: 29 %
Lymphs Abs: 2.5 10*3/uL (ref 0.7–4.0)
MCH: 29.4 pg (ref 26.0–34.0)
MCHC: 31.9 g/dL (ref 30.0–36.0)
MCV: 91.9 fL (ref 80.0–100.0)
Monocytes Absolute: 0.8 10*3/uL (ref 0.1–1.0)
Monocytes Relative: 10 %
Neutro Abs: 4.7 10*3/uL (ref 1.7–7.7)
Neutrophils Relative %: 56 %
Platelets: 254 10*3/uL (ref 150–400)
RBC: 4.19 MIL/uL (ref 3.87–5.11)
RDW: 13.7 % (ref 11.5–15.5)
WBC: 8.4 10*3/uL (ref 4.0–10.5)
nRBC: 0 % (ref 0.0–0.2)

## 2020-01-30 LAB — MAGNESIUM: Magnesium: 1.9 mg/dL (ref 1.7–2.4)

## 2020-01-30 LAB — COMPREHENSIVE METABOLIC PANEL
ALT: 27 U/L (ref 0–44)
AST: 27 U/L (ref 15–41)
Albumin: 2.6 g/dL — ABNORMAL LOW (ref 3.5–5.0)
Alkaline Phosphatase: 61 U/L (ref 38–126)
Anion gap: 10 (ref 5–15)
BUN: 21 mg/dL (ref 8–23)
CO2: 27 mmol/L (ref 22–32)
Calcium: 9 mg/dL (ref 8.9–10.3)
Chloride: 103 mmol/L (ref 98–111)
Creatinine, Ser: 0.61 mg/dL (ref 0.44–1.00)
GFR calc Af Amer: >60 mL/min (ref >60)
GFR calc non Af Amer: >60 mL/min (ref >60)
Glucose, Bld: 97 mg/dL (ref 70–99)
Potassium: 4.6 mmol/L (ref 3.5–5.1)
Sodium: 140 mmol/L (ref 135–145)
Total Bilirubin: 0.8 mg/dL (ref 0.3–1.2)
Total Protein: 6.2 g/dL — ABNORMAL LOW (ref 6.5–8.1)

## 2020-01-30 NOTE — Progress Notes (Signed)
[facility] PROGRESS NOTE  Patient: Janet [name] KFW:[id number]   PCP: Prince Solian, MD DOB: 1935/01/02   DOA: 01/24/2020   DOS: 01/30/2020    Subjective: Remains confused.  Not participating in interview.  Objective:  Vitals:   01/30/20 0923 01/30/20 1638  BP: (!) 147/58 136/62  Pulse: 62 65  Resp: 18 18  Temp: 98.1 F (36.7 C) 98.4 F (36.9 C)  SpO2: 95% 94%    Refusing examination at present.  Assessment and plan: Symptomatic anemia. H&H stable. No further work-up for now.  Recent hospitalization with hip fracture. PT OT recommended SNF. Currently waiting for insurance authorization.  Elevated LFT. Repeat normal.   Author: Berle Mull, MD [facility] 01/30/2020 5:18 PM   If 7PM-7AM, please contact night-coverage at [email or web address]

## 2020-01-31 LAB — SARS CORONAVIRUS 2 BY RT PCR (HOSPITAL ORDER, PERFORMED IN ~~LOC~~ HOSPITAL LAB): SARS Coronavirus 2: NEGATIVE

## 2020-01-31 MED ORDER — HYDROCODONE-ACETAMINOPHEN 5-325 MG PO TABS: 1.0000 | ORAL_TABLET | Freq: Three times a day (TID) | ORAL | 0 refills | Status: DC | PRN

## 2020-01-31 MED ORDER — DOCUSATE SODIUM 100 MG PO CAPS: 100.0000 mg | ORAL_CAPSULE | Freq: Two times a day (BID) | ORAL | 0 refills | Status: DC

## 2020-01-31 MED ORDER — ENOXAPARIN SODIUM 40 MG/0.4ML ~~LOC~~ SOLN: 40.0000 mg | Freq: Every day | SUBCUTANEOUS | 0 refills | Status: DC
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
## 2020-01-31 NOTE — Progress Notes (Signed)
Occupational Therapy Treatment Patient Details Name: Janet [name] MRN: [id number] DOB: 1934-11-08 Today's Date: 01/31/2020    History of present illness Pt is an 85 y/o female presenting from SNF secondary to low Hgb. Pt with recent fall and is s/p R hip himarthroplasty, direct anterior. PMH includes dementia, covid, and TIA.    OT comments  Patient supine in bed on arrival.  She was keeping her eyes mostly closed but initially participating.  Followed commands with increased verbal and tactile cueing.  Began working on UE AROM/PROM at bed level and mid way patient because agitated yelling at therapist to stop.  Attempted to change activity to grooming.  Patient requiring total assist with washing face.  Will check in once more with patient and attempt progress, though likely will not benefit from further acute level therapy based on participation level and baseline assist needed.    Follow Up Recommendations  SNF;Supervision/Assistance - 24 hour    Equipment Recommendations  Wheelchair cushion (measurements OT);Wheelchair (measurements OT);Hospital bed    Recommendations for Other Services      Precautions / Restrictions Precautions Precautions: Fall Precaution Booklet Issued: No       Mobility Bed Mobility               General bed mobility comments: Patient too agitated to attempt  Transfers                 General transfer comment: deferred    Balance                                           ADL either performed or assessed with clinical judgement   ADL Overall ADL's : Needs assistance/impaired     Grooming: Total assistance                                       Vision       Perception     Praxis      Cognition Arousal/Alertness: Awake/alert Behavior During Therapy: Agitated;Anxious Overall Cognitive Status: History of cognitive impairments - at baseline Area of Impairment: Memory;Following  commands;Safety/judgement;Awareness;Problem solving                 Orientation Level: Disoriented to;Place;Time;Situation   Memory: Decreased short-term memory;Decreased recall of precautions Following Commands: Follows one step commands inconsistently;Follows one step commands with increased time Safety/Judgement: Decreased awareness of safety;Decreased awareness of deficits Awareness: Intellectual Problem Solving: Slow processing;Difficulty sequencing;Decreased initiation;Requires tactile cues;Requires verbal cues General Comments: Yelling for therapist to leave her alone mid session.  "Stop talking to me! Stop messing with me!"        Exercises Exercises: General Upper Extremity General Exercises - Upper Extremity Shoulder Flexion: PROM;5 reps;Left Elbow Flexion: PROM;5 reps;Left Digit Composite Flexion: AROM;Left;5 reps   Shoulder Instructions       General Comments      Pertinent Vitals/ Pain       Pain Assessment: Faces Faces Pain Scale: Hurts even more Pain Location: generalized; with L shouder PROM Pain Descriptors / Indicators: Grimacing;Guarding Pain Intervention(s): Limited activity within patient's tolerance;Repositioned;Monitored during session  Home Living  Prior Functioning/Environment              Frequency  Min 2X/week        Progress Toward Goals  OT Goals(current goals can now be found in the care plan section)  Progress towards OT goals: Progressing toward goals  Acute Rehab OT Goals Patient Stated Goal: unable OT Goal Formulation: Patient unable to participate in goal setting Time For Goal Achievement: 02/08/20 Potential to Achieve Goals: Poor  Plan Discharge plan remains appropriate    Co-evaluation                 AM-PAC OT "6 Clicks" Daily Activity     Outcome Measure   Help from another person eating meals?: Total Help from another person taking care of  personal grooming?: Total Help from another person toileting, which includes using toliet, bedpan, or urinal?: Total Help from another person bathing (including washing, rinsing, drying)?: Total Help from another person to put on and taking off regular upper body clothing?: Total Help from another person to put on and taking off regular lower body clothing?: Total 6 Click Score: 6    End of Session    OT Visit Diagnosis: Unsteadiness on feet (R26.81);Muscle weakness (generalized) (M62.81);Other abnormalities of gait and mobility (R26.89);History of falling (Z91.81);Other symptoms and signs involving cognitive function;Pain Pain - Right/Left: Left Pain - part of body: Shoulder   Activity Tolerance Patient tolerated treatment well   Patient Left in bed;with call bell/phone within reach;with bed alarm set   Nurse Communication Mobility status        Time: 1438-8875 OT Time Calculation (min): 12 min  Charges: OT General Charges $OT Visit: 1 Visit OT Treatments $Therapeutic Activity: 8-22 mins  August Luz, OTR/L    Phylliss Bob 01/31/2020, 1:16 PM

## 2020-01-31 NOTE — Progress Notes (Signed)
Called report to [facility]  For patient return to SNF.

## 2020-01-31 NOTE — TOC Transition Note (Addendum)
Transition of Care [facility]) - CM/SW Discharge Note Discharged to [facility] Phone Number for Report - [phone]   Patient Details  Name: Janet [name] MRN: [id number] Date of Birth: 10-27-1934  [facility] of [facility]) CM/SW Contact:  Sable Feil, LCSW Phone Number: 01/31/2020, 3:08 PM   Clinical Narrative:  Patient medically stable for discharge and is returning to [facility] to continue rehab. Admissions director Kenney Houseman contacted and informed regarding discharge and insurance auth information. Discharge clinicals transmitted to facility. HCPOA Kaylyn Layer [phone]) contacted and advised regarding discharge. Patient will be transported to facility by [facility].     Final next level of care: [place] Barriers to Discharge: Barriers Resolved   Patient Goals and CMS Choice Patient states their goals for this hospitalization and ongoing recovery are:: HCPOA-Susan Zigmund Daniel advised regarding discharge and remains agreeable to patient returning to [email or web address] Compare Post Acute Care list provided to:: Other (Comment Required) (Medicare.gov not provided as patient returning  to [facility]) Choice offered to / list presented to : NA  Discharge Placement   Existing PASRR number confirmed : 01/29/20          Patient chooses bed at: [facility] Patient to be transferred to facility by: Non-emergency ambulance transport Name of family member notified: Darrel Reach - [phone] Patient and family notified of of transfer: 01/31/20  Discharge Plan and Services                                    Social Determinants of Health (SDOH) Interventions  No SDOH interventions requested or needed at discharge   Readmission Risk Interventions No flowsheet data found.

## 2020-02-01 ENCOUNTER — Non-Acute Institutional Stay (SKILLED_NURSING_FACILITY): Payer: Medicare Other | Admitting: Internal Medicine

## 2020-02-01 ENCOUNTER — Encounter: Payer: Self-pay | Admitting: Internal Medicine

## 2020-02-01 DIAGNOSIS — E8809 Other disorders of plasma-protein metabolism, not elsewhere classified: Secondary | ICD-10-CM

## 2020-02-01 DIAGNOSIS — D649 Anemia, unspecified: Secondary | ICD-10-CM

## 2020-02-01 DIAGNOSIS — S72001A Fracture of unspecified part of neck of right femur, initial encounter for closed fracture: Secondary | ICD-10-CM | POA: Diagnosis not present

## 2020-02-01 DIAGNOSIS — R7989 Other specified abnormal findings of blood chemistry: Secondary | ICD-10-CM | POA: Diagnosis not present

## 2020-02-01 NOTE — Assessment & Plan Note (Signed)
Nutritionist will supplement protein at [facility]

## 2020-02-01 NOTE — Assessment & Plan Note (Signed)
Lovenox prophylaxis until 7/7. PT/OT at SNF as dementia allows.

## 2020-02-01 NOTE — Progress Notes (Signed)
NURSING HOME LOCATION:  [facility] ROOM NUMBER:  [id number]  CODE STATUS:  DNR  PCP:  Prince Solian, MD  [place] [id number]   This is a [place] readmission within 30 days.   Interim medical record and care since last [employer] visit was updated with review of diagnostic studies and change in clinical status since last visit were documented.  HPI: The patient originally was admitted to the SNF on 6/24 having been hospitalized 6/18-6/24 after a witnessed mechanical fall at another facility resulting in a closed displaced fracture of the right femoral neck.  Right hip hemiarthroplasty was performed 6/19 by Dr. Erlinda Hong.  Postoperatively hemoglobin was in the 7 range but improved serially without transfusions.  At admission H/H had been 11.5/35.1.  [place] H/H was 7.1/22.3. She has a history of chronic thrombocytopenia.  The platelet count was mildly decreased at 123,000 but dropped to 89,000.  At discharge it had returned to normal with a value of 159,000. Lovenox was prescribed for postop DVT prophylaxis. Incidental finding during the hospitalization was a thyroid nodule noted on CT and cervical spine.  Ultrasound confirmed the nodule meeting criteria for fine-needle aspiration.  This was to be scheduled after stabilization at the SNF. The patient was sent back to the ED on 6/28 because of hemoglobin of 6.8.  No bleeding dyscrasias had been noted at the SNF.  FOB was negative in the ED. She received 2 units of PRBCs.  The anemia was attributed to dilutional anemia of from fluid volume supplementation during the prior hospitalization as no acute hemorrhage or bleeding dyscrasia was noted.  Iron level was 22 prompting administration of Feraheme.  B12 and folate levels were normal. Lovenox was held because of concern for rebleeding.  It was resumed for a total of 13 days with a stop date of 7/7. Hypokalemia and hypomagnesemia were corrected.  Mild elevation of  LFTs and bilirubin were noted.  Hepatitis panel was nonreactive.  Depakote level was low but dosage was not increased because of the LFT elevations. Sundowning was noted during the hospitalization.  Other diagnoses include essential hypertension, dyslipidemia, and dementia.  Review of systems: Dementia invalidated responses.  Initially she was sound asleep exhibiting hypopneas and intermittent apnea.  No snoring was noted.  When she did awaken she opened her eyes wide but looked to the right.  She stated that "I am all right".  She addressed me as "ma'am".  When asked the date she stated "I have no idea".  She denied any active issues. PT/OT states that she is eating better than when last here.    Physical exam: Pertinent or positive findings: Hypopnea and apnea as noted.  She appears cachectic.  The ribs are visible anteriorly over the superior chest.  Limb atrophy is present.  She is edentulous.  She exhibits a tremor of the mandible.  Slight tachycardia was noted.  Breath sounds were decreased.  She exhibited intermittent coarse, nonproductive cough.  Pedal pulses were decreased.  Strength could not be tested as she would not oppose my hands.  She does have mixed arthritic change of the hands.  General appearance:no acute distress, increased work of breathing is present.   Lymphatic: No lymphadenopathy about the head, neck, axilla. Eyes: No conjunctival inflammation or lid edema is present. There is no scleral icterus. Ears:  External ear exam shows no significant lesions or deformities.   Nose:  External nasal examination shows no deformity or inflammation. Nasal mucosa are  pink and moist without lesions, exudates Neck:  No thyromegaly, masses, tenderness noted.    Heart:  No murmur, click, rub .  Lungs:  without wheezes, rhonchi, rales, rubs. Abdomen: Bowel sounds are normal. Abdomen is soft and nontender with no organomegaly, hernias, masses. GU: Deferred  Extremities:  No cyanosis,  clubbing, edema  Neurologic exam :Balance, Rhomberg, finger to nose testing could not be completed due to clinical state Deep tendon reflexes are equal Skin: Warm & dry w/o tenting. No significant lesions or rash.  See summary under each active problem in the Problem List with associated updated therapeutic plan

## 2020-02-01 NOTE — Assessment & Plan Note (Signed)
7/4 CBC and differential normal.  H/H 12.3/38.5.  Platelet count 254,000.

## 2020-02-01 NOTE — Patient Instructions (Signed)
See assessment and plan under each diagnosis in the problem list and acutely for this visit 

## 2020-02-01 NOTE — Assessment & Plan Note (Signed)
All LFTs normal predischarge 7/4.

## 2020-02-08 ENCOUNTER — Encounter: Payer: Self-pay | Admitting: Adult Health

## 2020-02-08 ENCOUNTER — Non-Acute Institutional Stay (SKILLED_NURSING_FACILITY): Payer: Medicare Other | Admitting: Adult Health

## 2020-02-08 DIAGNOSIS — F39 Unspecified mood [affective] disorder: Secondary | ICD-10-CM

## 2020-02-08 DIAGNOSIS — S72001A Fracture of unspecified part of neck of right femur, initial encounter for closed fracture: Secondary | ICD-10-CM

## 2020-02-08 DIAGNOSIS — F339 Major depressive disorder, recurrent, unspecified: Secondary | ICD-10-CM

## 2020-02-08 DIAGNOSIS — E876 Hypokalemia: Secondary | ICD-10-CM

## 2020-02-08 DIAGNOSIS — E041 Nontoxic single thyroid nodule: Secondary | ICD-10-CM | POA: Diagnosis not present

## 2020-02-08 DIAGNOSIS — R630 Anorexia: Secondary | ICD-10-CM

## 2020-02-08 DIAGNOSIS — D649 Anemia, unspecified: Secondary | ICD-10-CM | POA: Diagnosis not present

## 2020-02-08 DIAGNOSIS — I10 Essential (primary) hypertension: Secondary | ICD-10-CM

## 2020-02-08 DIAGNOSIS — F0390 Unspecified dementia without behavioral disturbance: Secondary | ICD-10-CM

## 2020-02-08 NOTE — Progress Notes (Signed)
This encounter was created in error - please disregard.

## 2020-02-08 NOTE — Progress Notes (Signed)
Location:  [place] Room Number: 952-W Place of Service:  SNF (31) Provider:  Durenda Age, DNP, FNP-BC  Patient Care Team: Prince Solian, MD as PCP - General (Internal Medicine)  Extended Emergency Contact Information Primary Emergency Contact: Kaylyn Layer Mobile Phone: [phone] Relation: Daughter Secondary Emergency Contact: Shuke,Joyce Address: [address], [place] [address] [place] Phone: [phone] Relation: Daughter  Code Status:  DNR  Goals of care: Advanced Directive information Advanced Directives 02/08/2020  Does Patient Have a Medical Advance Directive? Yes  Type of Advance Directive Out of facility DNR (pink MOST or yellow form)  Does patient want to make changes to medical advance directive? No - Patient declined  Would patient like information on creating a medical advance directive? -  Pre-existing out of facility DNR order (yellow form or pink MOST form) Yellow form placed in chart (order not valid for inpatient use)     Chief Complaint  Patient presents with  . Medical Management of Chronic Issues    Routine short-term rehabilitation visit    HPI:  Pt is an 85 y.o. female seen today for medical management of chronic diseases.  She is a short-term care segment of [facility].  He has a PMH of TIA, vascular dementia, essential hypertension, dyslipidemia, history of Covid in December 2020, [place] and history of cancer.  She was seen in her room today. She was noted to be sleepy but verbally responsive.  She was readmitted to [place] on 01/31/20 post [facility] hospitalization 01/24/20 to 01/31/20 for severe anemia hgb 6.8. FOBT was negative. Anemia was thought to be dilutional from recent hospitalization.  Iron level 22 so she was transfused 1 Feraheme.  She was, also, transfused 2 units PRBC.  Repeat hemoglobin after blood transfusion was  9.4.  Lovenox was held due to concern for bleeding.  Of note, she was having short-term rehabilitation at [place] prior to hospitalization. She was originally admitted to [facility] on 01/20/20 post hospitalization 01/14/2020 to 01/20/2020 S/P mechanical fall sustaining right femoral neck fracture.   Past Medical History:  Diagnosis Date  . Cancer ([place])   . High cholesterol   . Neuralgia facialis vera   . TIA (transient ischemic attack)    Past Surgical History:  Procedure Laterality Date  . ANTERIOR APPROACH HEMI HIP ARTHROPLASTY Right 01/15/2020   Procedure: ANTERIOR APPROACH HEMI HIP ARTHROPLASTY;  Surgeon: Leandrew Koyanagi, MD;  Location: [place];  Service: Orthopedics;  Laterality: Right;    Allergies  Allergen Reactions  . Aspirin Nausea And Vomiting  . Penicillins Hives  . Sulfa Antibiotics Nausea And Vomiting  . Sulfamethoprim [Sulfamethoxazole-Trimethoprim] Other (See Comments)    Per [facility]    Outpatient Encounter Medications as of 02/08/2020  Medication Sig  . acetaminophen (TYLENOL) 325 MG tablet Take 650 mg by mouth in the morning and at bedtime.  Marland Kitchen acetaminophen (TYLENOL) 500 MG tablet Take 500 mg by mouth 2 (two) times daily as needed for moderate pain.   Marland Kitchen alum & mag hydroxide-simeth (MYLANTA) [phone] MG/5ML suspension Take 30 mLs by mouth every 6 (six) hours as needed for indigestion or heartburn.  Marland Kitchen ascorbic acid (VITAMIN C) 500 MG tablet Take 500 mg by mouth 2 (two) times daily.  Marland Kitchen atorvastatin (LIPITOR) 40 MG tablet Take 40 mg by mouth at bedtime.   . bisacodyl (DULCOLAX) 10 MG suppository Place 10  mg rectally daily as needed (constipation not relieved by MOM).  . cholecalciferol (VITAMIN D3) 25 MCG (1000 UNIT) tablet Take 1,000 Units by mouth daily.  . divalproex (DEPAKOTE) 250 MG DR tablet Take 250 mg by mouth 2 (two) times daily with a meal.   . docusate sodium (COLACE) 100 MG capsule Take 1 capsule (100 mg total) by mouth 2 (two) times  daily.  Marland Kitchen donepezil (ARICEPT) 10 MG tablet Take 10 mg by mouth in the morning and at bedtime.  Marland Kitchen escitalopram (LEXAPRO) 10 MG tablet Take 10 mg by mouth daily.  . ferrous sulfate 325 (65 FE) MG tablet Take 325 mg by mouth daily.  Marland Kitchen guaiFENesin (ROBITUSSIN) 100 MG/5ML liquid Take 200 mg by mouth 4 (four) times daily as needed for cough or congestion.  . magnesium hydroxide (MILK OF MAGNESIA) 400 MG/5ML suspension Take 30 mLs by mouth daily as needed (if no BM in 3 days).   . metoprolol succinate (TOPROL-XL) 25 MG 24 hr tablet Take 25 mg by mouth daily.  . mirtazapine (REMERON) 7.5 MG tablet Take 7.5 mg by mouth at bedtime.  . Multiple Vitamin (MULTIVITAMIN WITH MINERALS) TABS tablet Take 1 tablet by mouth daily.  . Nutritional Supplements (NUTRITIONAL SUPPLEMENT PO) Take 120 mLs by mouth 3 (three) times daily. MedPass  . Sodium Phosphates (FLEET ENEMA RE) Place 1 enema rectally daily as needed (constipation not relieved by Bisacodyl suppository).  . solifenacin (VESICARE) 5 MG tablet Take 5 mg by mouth daily.   . Zinc 50 MG TABS Take 50 mg by mouth in the morning and at bedtime.  . [DISCONTINUED] Ascorbic Acid (VITAMIN C WITH ROSE HIPS) 500 MG tablet Take 500 mg by mouth in the morning and at bedtime.  . [DISCONTINUED] enoxaparin (LOVENOX) 40 MG/0.4ML injection Inject 0.4 mLs (40 mg total) into the skin daily for 2 days.  . [DISCONTINUED] HYDROcodone-acetaminophen (NORCO) 5-325 MG tablet Take 1 tablet by mouth 3 (three) times daily as needed.   No facility-administered encounter medications on file as of 02/08/2020.    Review of Systems  Unable to obtain due to dementia    There is no immunization history on file for this patient.   Pertinent  Health Maintenance Due  Topic Date Due  . DEXA SCAN  Never done  . PNA vac Low Risk Adult (1 of 2 - PCV13) Never done  . INFLUENZA VACCINE  02/27/2020   No flowsheet data found.   Vitals:   02/08/20 0835  BP: 132/74  Pulse: 97  Resp: 20    Temp: (!) 97.5 F (36.4 C)  TempSrc: Oral  Weight: 117 lb 6.4 oz (53.3 kg)  Height: 5\' 1"  (1.549 m)   Body mass index is 22.18 kg/m.  Physical Exam  GENERAL APPEARANCE: Well nourished. In no acute distress. Normal body habitus SKIN: Right hip surgical incision with staples intact, dry and no erythema MOUTH and THROAT: Lips are without lesions. Oral mucosa is moist and without lesions.  RESPIRATORY: Breathing is even & unlabored, BS CTAB CARDIAC: RRR, no murmur,no extra heart sounds, no edema GI: Abdomen soft, normal BS, no masses, no tenderness NEUROLOGICAL: There is no tremor. Speech is clear.  Alert to self, disoriented to time and place. PSYCHIATRIC:  Affect and behavior are appropriate  Labs reviewed: Recent Labs    01/24/20 1722 01/25/20 0033 01/25/20 0500 01/30/20 0406  NA 141  --  138 140  K 3.1*  --  4.4 4.6  CL 103  --  106  103  CO2 29  --  24 27  GLUCOSE 148*  --  91 97  BUN 27*  --  22 21  CREATININE 0.67  --  0.54 0.61  CALCIUM 8.2*  --  7.3* 9.0  MG  --  1.8 1.6* 1.9  PHOS  --  3.8 2.5  --    Recent Labs    01/24/20 1722 01/25/20 0500 01/30/20 0406  AST 43* 38 27  ALT 57* 45* 27  ALKPHOS 66 52 61  BILITOT 0.7 1.5* 0.8  PROT 5.5* 4.9* 6.2*  ALBUMIN 2.1* 1.9* 2.6*   Recent Labs    01/24/20 1722 01/25/20 0033 01/25/20 0500 01/25/20 1203 01/25/20 2351 01/26/20 0450 01/30/20 0406  WBC 7.2   < > 8.0   < > 9.4 9.6 8.4  NEUTROABS 4.6  --  5.2  --   --   --  4.7  HGB 7.0*   < > 9.4*   < > 10.3* 12.2 12.3  HCT 22.7*   < > 28.5*   < > 31.3* 35.2* 38.5  MCV 93.0   < > 88.5   < > 86.7 86.3 91.9  PLT 303   < > 232   < > 254 232 254   < > = values in this interval not displayed.   Lab Results  Component Value Date   TSH 2.184 01/25/2020   Lab Results  Component Value Date   HGBA1C  09/21/2009    5.8 (NOTE) The ADA recommends the following therapeutic goal for glycemic control related to Hgb A1c measurement: Goal of therapy: <6.5 Hgb A1c   Reference: American Diabetes Association: Clinical Practice Recommendations 2010, Diabetes Care, 2010, 33: (Suppl  1).   Lab Results  Component Value Date   CHOL  09/22/2009    123        ATP III CLASSIFICATION:  <200     mg/dL   Desirable  200-239  mg/dL   Borderline High  >=240    mg/dL   High          HDL 34 (L) 09/22/2009   LDLCALC  09/22/2009    64        Total Cholesterol/HDL:CHD Risk Coronary Heart Disease Risk Table                     Men   Women  1/2 Average Risk   3.4   3.3  Average Risk       5.0   4.4  2 X Average Risk   9.6   7.1  3 X Average Risk  23.4   11.0        Use the calculated Patient Ratio above and the CHD Risk Table to determine the patient's CHD Risk.        ATP III CLASSIFICATION (LDL):  <100     mg/dL   Optimal  100-129  mg/dL   Near or Above                    Optimal  130-159  mg/dL   Borderline  160-189  mg/dL   High  >190     mg/dL   Very High   TRIG 127 09/22/2009   CHOLHDL 3.6 09/22/2009    Significant Diagnostic Results in last 30 days:  DG Chest 1 View  Result Date: 01/14/2020 CLINICAL DATA:  Status post fall. EXAM: CHEST  1 VIEW COMPARISON:  None. FINDINGS: There is no  evidence of acute infiltrate, pleural effusion or pneumothorax. The heart size and mediastinal contours are within normal limits. The visualized skeletal structures are unremarkable. IMPRESSION: No active disease. Electronically Signed   By: Virgina Norfolk M.D.   On: 01/14/2020 21:28   CT HEAD WO CONTRAST  Result Date: 01/14/2020 CLINICAL DATA:  Witnessed fall today.  Dementia patient. EXAM: CT HEAD WITHOUT CONTRAST TECHNIQUE: Contiguous axial images were obtained from the base of the skull through the vertex without intravenous contrast. COMPARISON:  Most recent head CT 07/12/2011 FINDINGS: Brain: No intracranial hemorrhage, mass effect, or midline shift. Moderate generalized atrophy and chronic small vessel ischemia. Remote lacunar infarct in the left basal  ganglia. No hydrocephalus. The basilar cisterns are patent. No evidence of territorial infarct or acute ischemia. No extra-axial or intracranial fluid collection. Vascular: Atherosclerosis of skullbase vasculature without hyperdense vessel or abnormal calcification. Skull: No fracture or focal lesion. Sinuses/Orbits: Mucosal thickening of the ethmoid air cells and right greater than left maxillary sinus. Left mastoid air cells are hypo pneumatized. No acute findings. Left lens extraction. Other: None. IMPRESSION: 1. No acute intracranial abnormality. No skull fracture. 2. Generalized atrophy and chronic small vessel ischemia. Remote lacunar infarct in the left basal ganglia. Electronically Signed   By: Keith Rake M.D.   On: 01/14/2020 21:44   CT CERVICAL SPINE WO CONTRAST  Result Date: 01/14/2020 CLINICAL DATA:  Witnessed fall today.  Dementia patient. EXAM: CT CERVICAL SPINE WITHOUT CONTRAST TECHNIQUE: Multidetector CT imaging of the cervical spine was performed without intravenous contrast. Multiplanar CT image reconstructions were also generated. COMPARISON:  None. FINDINGS: Alignment: Trace anterolisthesis of C7 on T1 is likely facet mediated on degenerative. No traumatic subluxation. No jumped or perched facets. C1 is well aligned with C2. Skull base and vertebrae: No acute fracture. Vertebral body heights are maintained. The dens and skull base are intact. Soft tissues and spinal canal: No prevertebral fluid or swelling. No visible canal hematoma. Disc levels: Mild diffuse degenerative disc disease with disc space narrowing and endplate spurring. There is multilevel facet hypertrophy. Upper chest: 17 mm hypodense nodule in the left lobe of the thyroid gland. No acute findings. Other: Carotid calcifications. IMPRESSION: 1. Mild degenerative change in the cervical spine without acute fracture or subluxation. 2. A 17 mm hypodense nodule in the left lobe of the thyroid gland. In the setting of  significant comorbidities or limited life expectancy, no follow-up recommended (ref: J Am Coll Radiol. 2015 Feb;12(2): 143-50). Electronically Signed   By: Keith Rake M.D.   On: 01/14/2020 21:47   Pelvis Portable  Result Date: 01/15/2020 CLINICAL DATA:  Right hip fracture post arthroplasty. EXAM: PORTABLE PELVIS 1-2 VIEWS COMPARISON:  01/14/2020 FINDINGS: Examination demonstrates expected changes post right hip arthroplasty which is intact and normally located. Mild degenerate change of the left hip. No acute fracture or dislocation. Mild degenerative change of the spine. IMPRESSION: Expected changes post right hip arthroplasty. Electronically Signed   By: Marin Olp M.D.   On: 01/15/2020 11:39   CT HIP RIGHT WO CONTRAST  Result Date: 01/14/2020 CLINICAL DATA:  Right hip fracture EXAM: CT OF THE RIGHT HIP WITHOUT CONTRAST TECHNIQUE: Multidetector CT imaging of the right hip was performed according to the standard protocol. Multiplanar CT image reconstructions were also generated. COMPARISON:  Radiograph same day FINDINGS: Bones/Joint/Cartilage There is a comminuted impacted fracture of the transcervical right femoral neck. There is superior and anterior subluxation with angulation of the femoral shaft. The femoral head is still well  seated within the acetabulum. There is small fracture fragment seen adjacent to the femoral neck. A sclerotic foci seen within the inferior femoral head. There is diffuse osteopenia. No other definite fractures are identified. Ligaments Suboptimally assessed by CT. Muscles and Tendons The muscles surrounding the hip normal appearance without evidence of focal atrophy or tear. The visualized portions of the tendons appear to be grossly intact. Soft tissues Soft tissue swelling seen over the lateral aspect of the hip. Colonic diverticula are noted without diverticulitis. IMPRESSION: Comminuted impacted displaced anteriorly angulated transcervical right femoral neck  fracture. Electronically Signed   By: Prudencio Pair M.D.   On: 01/14/2020 23:02   DG C-Arm 1-60 Min  Result Date: 01/15/2020 CLINICAL DATA:  Right hip fracture post right hip arthroplasty. EXAM: OPERATIVE RIGHT HIP (WITH PELVIS IF PERFORMED) 3 VIEWS TECHNIQUE: Fluoroscopic spot image(s) were submitted for interpretation post-operatively. COMPARISON:  01/14/2020 FINDINGS: Examination demonstrates evidence of patient's subcapital right femoral neck fracture with subsequent images demonstrate placement of a right hip arthroplasty intact and normally located. IMPRESSION: Expected changes post right hip arthroplasty. Electronically Signed   By: Marin Olp M.D.   On: 01/15/2020 11:38   US THYROID  Result Date: 01/18/2020 CLINICAL DATA:  Left thyroid nodule. Thyroid nodule noted on cervical spine CT. EXAM: THYROID ULTRASOUND TECHNIQUE: Ultrasound examination of the thyroid gland and adjacent soft tissues was performed. COMPARISON:  Cervical spine CT 01/13/2017 FINDINGS: Parenchymal Echotexture: Mildly heterogenous Isthmus: 0.3 cm Right lobe: 2.7 x 1.0 x 1.4 cm Left lobe: 3.3 x 1.7 x 1.8 cm _________________________________________________________ Estimated total number of nodules >/= 1 cm: 1 Number of spongiform nodules >/=  2 cm not described below (TR1): 0 Number of mixed cystic and solid nodules >/= 1.5 cm not described below (TR2): 0 _________________________________________________________ Nodule # 1: Location: Left; Mid Maximum size: 2.0 cm; Other 2 dimensions: 1.3 x 1.5 cm Composition: solid/almost completely solid (2) Echogenicity: hypoechoic (2) Shape: not taller-than-wide (0) Margins: ill-defined (0) Echogenic foci: none (0) ACR TI-RADS total points: 4. ACR TI-RADS risk category: TR4 (4-6 points). ACR TI-RADS recommendations: **Given size (>/= 1.5 cm) and appearance, fine needle aspiration of this moderately suspicious nodule should be considered based on TI-RADS criteria.  _________________________________________________________ No discrete right thyroid nodule. IMPRESSION: Left thyroid nodule measures up to 2.0 cm and compatible with a TR 4 nodule. This nodule meets criteria for ultrasound-guided biopsy. The above is in keeping with the ACR TI-RADS recommendations - J Am Coll Radiol 2017;14:587-595. Electronically Signed   By: Markus Daft M.D.   On: 01/18/2020 08:14   DG HIP OPERATIVE UNILAT W OR W/O PELVIS RIGHT  Result Date: 01/15/2020 CLINICAL DATA:  Right hip fracture post right hip arthroplasty. EXAM: OPERATIVE RIGHT HIP (WITH PELVIS IF PERFORMED) 3 VIEWS TECHNIQUE: Fluoroscopic spot image(s) were submitted for interpretation post-operatively. COMPARISON:  01/14/2020 FINDINGS: Examination demonstrates evidence of patient's subcapital right femoral neck fracture with subsequent images demonstrate placement of a right hip arthroplasty intact and normally located. IMPRESSION: Expected changes post right hip arthroplasty. Electronically Signed   By: Marin Olp M.D.   On: 01/15/2020 11:38   DG Hip Unilat  With Pelvis 2-3 Views Right  Result Date: 01/14/2020 CLINICAL DATA:  Status post fall. EXAM: DG HIP (WITH OR WITHOUT PELVIS) 2-3V RIGHT COMPARISON:  None. FINDINGS: Acute fracture deformity is seen extending through the neck of the proximal right femur. Approximately 1/2 shaft width superior displacement of the distal fracture site is seen. There is no evidence of dislocation. There  is no evidence of significant arthropathy or other focal bone abnormality. IMPRESSION: Acute fracture of the proximal right femur. Electronically Signed   By: Virgina Norfolk M.D.   On: 01/14/2020 21:26   DG FEMUR, MIN 2 VIEWS RIGHT  Result Date: 01/14/2020 CLINICAL DATA:  Status post fall. EXAM: RIGHT FEMUR 2 VIEWS COMPARISON:  None. FINDINGS: Acute fracture deformity is seen extending through the neck of the proximal right femur. Approximately 1/2 shaft width superior displacement of  the distal fracture site is noted. There is no evidence of associated dislocation. Soft tissue structures are unremarkable. IMPRESSION: Acute fracture of the proximal right femur. Electronically Signed   By: Virgina Norfolk M.D.   On: 01/14/2020 21:27    Assessment/Plan  1. Anemia, unspecified type Lab Results  Component Value Date   HGB 12.3 01/30/2020   Lab Results  Component Value Date   IRON 22 (L) 01/24/2020   TIBC 186 (L) 01/24/2020   FERRITIN 1,005 (H) 01/24/2020    - S/P transfusion of 2 units PRBC and 1 Ferraheme -   Continue ferrous sulfate 325 mg 1 tab daily -   Recheck CBC  2. Closed displaced fracture of right femoral neck (HCC) -  S/P prosthetic replacement on 01/15/2020 by Dr. Erlinda Hong -   Lovenox held due to concern for bleeding -   Continue acetaminophen 650 mg twice a day and 500 mg twice a day PRN -   WBAT, continue PT and OT, for therapeutic strengthening exercises -   Follow-up with orthopedics, Dr. Erlinda Hong, on 02/10/2020 at 9:30 AM  3. Hypokalemia Lab Results  Component Value Date   K 4.6 01/30/2020   -Supplemented in the hospital, will recheck K  4. Thyroid nodule Lab Results  Component Value Date   TSH 2.184 01/25/2020   -Was recommended to have FNA, will schedule for ultrasound-guided FNA with interventional radiology  5. Essential hypertension -Stable, continue metoprolol succinate  6. Mood disorder ([place]) -   No reported agitation -   Due to sleepiness, will decrease Depakote from 250 mg BID to 125 mg BID  7. Poor appetite -   Continue mirtazapine 7.5 mg at bedtime  8. Major depression, recurrent, chronic (HCC) -  Mood is stable, continue Escitalopram 10 mg daily  9. Dementia without behavioral disturbance, unspecified dementia type (HCC) -Continue donepezil 10 mg twice a day and supportive care     Family/ staff Communication:   Discussed plan of care with resident and charge nurse.  Labs/tests ordered:  CBC and BMP on 02/09/2020  Goals  of care:   Short-term care   Durenda Age, DNP, FNP-BC [facility] [phone] (Monday-Friday 8:00 a.m. - 5:00 p.m.) [phone] (after hours)

## 2020-02-09 LAB — COMPREHENSIVE METABOLIC PANEL
Calcium: 9.7 (ref 8.7–10.7)
GFR calc Af Amer: >90
GFR calc non Af Amer: 86.2

## 2020-02-09 LAB — CBC: RBC: 4.02 (ref 3.87–5.11)

## 2020-02-09 LAB — BASIC METABOLIC PANEL
BUN: 37 — AB (ref 4–21)
CO2: 27 — AB (ref 13–22)
Chloride: 104 (ref 99–108)
Creatinine: 0.6 (ref 0.5–1.1)
Glucose: 127
Potassium: 3.7 (ref 3.4–5.3)
Sodium: 144 (ref 137–147)

## 2020-02-09 LAB — CBC AND DIFFERENTIAL
HCT: 36 (ref 36–46)
Hemoglobin: 11.8 — AB (ref 12.0–16.0)
Platelets: 190 (ref 150–399)
WBC: 9

## 2020-02-10 ENCOUNTER — Ambulatory Visit: Payer: Medicare Other | Admitting: Orthopaedic Surgery

## 2020-02-14 ENCOUNTER — Ambulatory Visit (INDEPENDENT_AMBULATORY_CARE_PROVIDER_SITE_OTHER): Payer: Medicare Other | Admitting: Orthopaedic Surgery

## 2020-02-14 ENCOUNTER — Encounter: Payer: Self-pay | Admitting: Physician Assistant

## 2020-02-14 ENCOUNTER — Ambulatory Visit (INDEPENDENT_AMBULATORY_CARE_PROVIDER_SITE_OTHER): Payer: Medicare Other

## 2020-02-14 ENCOUNTER — Other Ambulatory Visit: Payer: Self-pay | Admitting: Adult Health

## 2020-02-14 DIAGNOSIS — S72001A Fracture of unspecified part of neck of right femur, initial encounter for closed fracture: Secondary | ICD-10-CM | POA: Diagnosis not present

## 2020-02-14 DIAGNOSIS — Z96641 Presence of right artificial hip joint: Secondary | ICD-10-CM

## 2020-02-14 NOTE — Progress Notes (Signed)
° °  Post-Op Visit Note   Patient: Janet [name]           Date of Birth: Nov 01, 1934           MRN: [id number] Visit Date: 02/14/2020 PCP: Prince Solian, MD   Assessment & Plan:  Chief Complaint:  Chief Complaint  Patient presents with   Right Hip - Pain   Visit Diagnoses:  1. Closed displaced fracture of right femoral neck ([place])   2. S/P hip replacement, right     Plan: Patient is a pleasant 85 year old demented female who presents to clinic today 4 weeks out right hip hemiarthroplasty from a femoral neck fracture.  She has been in [facility] facility.  She comes in today on a stretcher via EMS.  This is her first postop visit.  Her daughter notes that she was ambulating prior to the fall but typically forgetting to use her cane or walker.  Examination of her right hip reveals a well healing surgical incision with staples intact.  No evidence of infection or cellulitis.  Today, staples were removed.  She may weight-bear as tolerated and continue with physical therapy.  Her daughter who is with her states that she will likely not return to ambulating.  She will follow up with [place] in 4 weeks time for repeat evaluation and AP pelvis x-rays.  Call with concerns or questions.  Follow-Up Instructions: Return in about 4 weeks (around 03/13/2020).   Orders:  Orders Placed This Encounter  Procedures   XR HIP UNILAT W OR W/O PELVIS 2-3 VIEWS RIGHT   No orders of the defined types were placed in this encounter.   Imaging: No results found.  PMFS History: Patient Active Problem List   Diagnosis Date Noted   Anemia 01/25/2020   Hypokalemia 01/24/2020   Symptomatic anemia 01/24/2020   Elevated LFTs 01/24/2020   Hypoalbuminemia 01/24/2020   Prolonged QT interval 01/24/2020   Acute blood loss as cause of postoperative anemia 01/21/2020   Closed displaced fracture of right femoral neck ([place]) 01/14/2020   Thyroid nodule 01/14/2020   Thrombocytopenia ([place]) 01/14/2020    Dementia ([place]) 01/14/2020   TIA (transient ischemic attack)    High cholesterol    Neuralgia facialis vera    Past Medical History:  Diagnosis Date   Cancer ([place])    High cholesterol    Neuralgia facialis vera    TIA (transient ischemic attack)     Family History  Problem Relation Age of Onset   Hypertension Other    Diabetes Neg Hx     Past Surgical History:  Procedure Laterality Date   ANTERIOR APPROACH HEMI HIP ARTHROPLASTY Right 01/15/2020   Procedure: ANTERIOR APPROACH HEMI HIP ARTHROPLASTY;  Surgeon: Leandrew Koyanagi, MD;  Location: [place];  Service: Orthopedics;  Laterality: Right;   Social History   Occupational History   Not on file  Tobacco Use   Smoking status: Never Smoker   Smokeless tobacco: Never Used  Substance and Sexual Activity   Alcohol use: No   Drug use: No   Sexual activity: Not on file

## 2020-02-17 ENCOUNTER — Encounter: Payer: Self-pay | Admitting: Adult Health

## 2020-02-17 ENCOUNTER — Non-Acute Institutional Stay (SKILLED_NURSING_FACILITY): Payer: Medicare Other | Admitting: Adult Health

## 2020-02-17 DIAGNOSIS — E041 Nontoxic single thyroid nodule: Secondary | ICD-10-CM

## 2020-02-17 DIAGNOSIS — S72001A Fracture of unspecified part of neck of right femur, initial encounter for closed fracture: Secondary | ICD-10-CM | POA: Diagnosis not present

## 2020-02-17 DIAGNOSIS — H1031 Unspecified acute conjunctivitis, right eye: Secondary | ICD-10-CM | POA: Diagnosis not present

## 2020-02-17 DIAGNOSIS — I10 Essential (primary) hypertension: Secondary | ICD-10-CM

## 2020-02-17 NOTE — Progress Notes (Signed)
Location:  [place] Room Number: 110/B Place of Service:  SNF (31) Provider:  Durenda Age, DNP, FNP-BC  Patient Care Team: Prince Solian, MD as PCP - General (Internal Medicine)  Extended Emergency Contact Information Primary Emergency Contact: Kaylyn Layer Mobile Phone: [phone] Relation: Daughter Secondary Emergency Contact: Shuke,Joyce Address: [place], [place] [address] [place] Phone: [phone] Relation: Daughter  Code Status:  DNR  Goals of care: Advanced Directive information Advanced Directives 02/17/2020  Does Patient Have a Medical Advance Directive? Yes  Type of Advance Directive Out of facility DNR (pink MOST or yellow form)  Does patient want to make changes to medical advance directive? No - Patient declined  Would patient like information on creating a medical advance directive? -  Pre-existing out of facility DNR order (yellow form or pink MOST form) -     Chief Complaint  Patient presents with  . Medical Management of Chronic Issues    Short Term Rehab    HPI:  Pt is a 85 y.o. female seen today for short term rehabilitation medical management of chronic diseases. She has a PMH of TIA, vascular dementia, essential hypertension, dyslipidemia, history of COVID-19 in December 2020, [place] and history of cancer. She was seen in the room today. Kaylyn Layer, POA, has stated that she does not want any procedures done nor consultation for resident's thyroid nodule. She stated that she wants her to live her remaining living years to be comfortable. Ultrasound-guided FNA with interventional radiology was canceled. She was recently started on Cipro ophthalmic drops for right eye conjunctivitis. SBP's ranging from 110 to 163, with outlier 173. He currently takes metoprolol succinate for hypertension. No reported agitation. Depakote dosage was recently decreased.   Past Medical History:   Diagnosis Date  . Cancer ([place])   . High cholesterol   . Neuralgia facialis vera   . TIA (transient ischemic attack)    Past Surgical History:  Procedure Laterality Date  . ANTERIOR APPROACH HEMI HIP ARTHROPLASTY Right 01/15/2020   Procedure: ANTERIOR APPROACH HEMI HIP ARTHROPLASTY;  Surgeon: Leandrew Koyanagi, MD;  Location: [facility];  Service: Orthopedics;  Laterality: Right;    Allergies  Allergen Reactions  . Aspirin Nausea And Vomiting  . Penicillins Hives  . Sulfa Antibiotics Nausea And Vomiting  . Sulfamethoprim [Sulfamethoxazole-Trimethoprim] Other (See Comments)    Per [facility]    Outpatient Encounter Medications as of 02/17/2020  Medication Sig  . acetaminophen (TYLENOL) 325 MG tablet Take 650 mg by mouth in the morning and at bedtime.  Marland Kitchen acetaminophen (TYLENOL) 500 MG tablet Take 500 mg by mouth 2 (two) times daily as needed for moderate pain.   Marland Kitchen alum & mag hydroxide-simeth (MYLANTA) 242-353-61 MG/5ML suspension Take 30 mLs by mouth every 6 (six) hours as needed for indigestion or heartburn.  Marland Kitchen ascorbic acid (VITAMIN C) 500 MG tablet Take 500 mg by mouth 2 (two) times daily.  Marland Kitchen atorvastatin (LIPITOR) 40 MG tablet Take 40 mg by mouth at bedtime.   . cholecalciferol (VITAMIN D3) 25 MCG (1000 UNIT) tablet Take 1,000 Units by mouth daily.  . ciprofloxacin (CILOXAN) 0.3 % ophthalmic solution Place 2 drops into the right eye in the morning, at noon, in the evening, and at bedtime. FOR 5 DAYS DX: RIGHT EYE CONJUNCTIVITIS  . divalproex (DEPAKOTE) 125 MG DR tablet Take 125 mg by mouth 2 (two) times daily. FOR MOOD DISORDER (  DO NOT CRUSH)  . donepezil (ARICEPT) 10 MG tablet Take 10 mg by mouth in the morning and at bedtime. For Dementia  . escitalopram (LEXAPRO) 10 MG tablet Take 10 mg by mouth daily. FOR DEPRESSION  . ferrous sulfate 325 (65 FE) MG tablet Take 325 mg by mouth daily. FOR ANEMIA  . guaiFENesin (ROBITUSSIN) 100 MG/5ML liquid Take 200 mg by mouth 4 (four) times daily as needed  for cough or congestion.  Marland Kitchen lactulose (CHRONULAC) 10 GM/15ML solution Take 20 g by mouth daily. For Constipation  . metoprolol succinate (TOPROL-XL) 25 MG 24 hr tablet Take 25 mg by mouth daily.  . mirtazapine (REMERON) 7.5 MG tablet Take 7.5 mg by mouth at bedtime.  . Multiple Vitamin (MULTIVITAMIN WITH MINERALS) TABS tablet Take 1 tablet by mouth daily.  . Nutritional Supplements (NUTRITIONAL SUPPLEMENT PO) Take 120 mLs by mouth 3 (three) times daily. MedPass  . solifenacin (VESICARE) 5 MG tablet Take 5 mg by mouth daily.   . Zinc 50 MG TABS Take 50 mg by mouth in the morning and at bedtime.  . [DISCONTINUED] bisacodyl (DULCOLAX) 10 MG suppository Place 10 mg rectally daily as needed (constipation not relieved by MOM).  . [DISCONTINUED] divalproex (DEPAKOTE) 250 MG DR tablet Take 250 mg by mouth 2 (two) times daily. FOR MOOD DISORDER (DO NOT CRUSH)  . [DISCONTINUED] docusate sodium (COLACE) 100 MG capsule Take 1 capsule (100 mg total) by mouth 2 (two) times daily.  . [DISCONTINUED] magnesium hydroxide (MILK OF MAGNESIA) 400 MG/5ML suspension Take 30 mLs by mouth daily as needed (if no BM in 3 days).   . [DISCONTINUED] Sodium Phosphates (FLEET ENEMA RE) Place 1 enema rectally daily as needed (constipation not relieved by Bisacodyl suppository).   No facility-administered encounter medications on file as of 02/17/2020.    Review of Systems  Unable to obtain due to dementia   There is no immunization history on file for this patient. Pertinent  Health Maintenance Due  Topic Date Due  . DEXA SCAN  Never done  . PNA vac Low Risk Adult (1 of 2 - PCV13) Never done  . INFLUENZA VACCINE  02/27/2020   No flowsheet data found.   Vitals:   02/17/20 1624  BP: (!) 143/78  Pulse: 73  Resp: 18  Temp: 97.7 F (36.5 C)  TempSrc: Oral  Weight: 115 lb 9.6 oz (52.4 kg)  Height: 5\' 1"  (1.549 m)   Body mass index is 21.84 kg/m.  Physical Exam  GENERAL APPEARANCE: Well nourished. In no acute  distress. Normal body habitus SKIN:  Right hip surgical incision is dry, no erythema MOUTH and THROAT: Lips are without lesions. Oral mucosa is moist and without lesions.  RESPIRATORY: Breathing is even & unlabored, BS CTAB CARDIAC: RRR, no murmur,no extra heart sounds, no edema GI: Abdomen soft, normal BS, no masses, no tenderness NEUROLOGICAL: There is no tremor. Speech is clear.  Alert to self, disoriented to time and place. PSYCHIATRIC:  Affect and behavior are appropriate  Labs reviewed: Recent Labs    01/24/20 1722 01/24/20 1722 01/25/20 0033 01/25/20 0500 01/30/20 0406 02/09/20 0000  NA 141   < >  --  138 140 144  K 3.1*   < >  --  4.4 4.6 3.7  CL 103   < >  --  106 103 104  CO2 29   < >  --  24 27 27*  GLUCOSE 148*  --   --  91 97  --  BUN 27*   < >  --  22 21 37*  CREATININE 0.67   < >  --  0.54 0.61 0.6  CALCIUM 8.2*   < >  --  7.3* 9.0 9.7  MG  --   --  1.8 1.6* 1.9  --   PHOS  --   --  3.8 2.5  --   --    < > = values in this interval not displayed.   Recent Labs    01/24/20 1722 01/25/20 0500 01/30/20 0406  AST 43* 38 27  ALT 57* 45* 27  ALKPHOS 66 52 61  BILITOT 0.7 1.5* 0.8  PROT 5.5* 4.9* 6.2*  ALBUMIN 2.1* 1.9* 2.6*   Recent Labs    01/24/20 1722 01/25/20 0033 01/25/20 0500 01/25/20 1203 01/25/20 2351 01/25/20 2351 01/26/20 0450 01/30/20 0406 02/09/20 0000  WBC 7.2   < > 8.0   < > 9.4   < > 9.6 8.4 9.0  NEUTROABS 4.6  --  5.2  --   --   --   --  4.7  --   HGB 7.0*   < > 9.4*   < > 10.3*   < > 12.2 12.3 11.8*  HCT 22.7*   < > 28.5*   < > 31.3*   < > 35.2* 38.5 36  MCV 93.0   < > 88.5   < > 86.7  --  86.3 91.9  --   PLT 303   < > 232   < > 254   < > 232 254 190   < > = values in this interval not displayed.   Lab Results  Component Value Date   TSH 2.184 01/25/2020   Lab Results  Component Value Date   HGBA1C  09/21/2009    5.8 (NOTE) The ADA recommends the following therapeutic goal for glycemic control related to Hgb A1c  measurement: Goal of therapy: <6.5 Hgb A1c  Reference: American Diabetes Association: Clinical Practice Recommendations 2010, Diabetes Care, 2010, 33: (Suppl  1).   Lab Results  Component Value Date   CHOL  09/22/2009    123        ATP III CLASSIFICATION:  <200     mg/dL   Desirable  200-239  mg/dL   Borderline High  >=240    mg/dL   High          HDL 34 (L) 09/22/2009   LDLCALC  09/22/2009    64        Total Cholesterol/HDL:CHD Risk Coronary Heart Disease Risk Table                     Men   Women  1/2 Average Risk   3.4   3.3  Average Risk       5.0   4.4  2 X Average Risk   9.6   7.1  3 X Average Risk  23.4   11.0        Use the calculated Patient Ratio above and the CHD Risk Table to determine the patient's CHD Risk.        ATP III CLASSIFICATION (LDL):  <100     mg/dL   Optimal  100-129  mg/dL   Near or Above                    Optimal  130-159  mg/dL   Borderline  160-189  mg/dL   High  >190  mg/dL   Very High   TRIG 127 09/22/2009   CHOLHDL 3.6 09/22/2009    Significant Diagnostic Results in last 30 days:  XR HIP UNILAT W OR W/O PELVIS 2-3 VIEWS RIGHT  Result Date: 02/14/2020 X-rays demonstrate a well-seated prosthesis without complication   Assessment/Plan  1. Acute bacterial conjunctivitis of right eye -  recently started on ciprofloxacin ophthalmic solution -Right conjunctiva, still with redness -Continue eye care daily  2. Thyroid nodule -  POA does not wish to do FNA and wants her to live life comfortably - POA does not wish to have any procedures nor consultation  3. Essential hypertension -Stable, continue metoprolol succinate  4. Closed displaced fracture of right femoral neck (HCC) -  S/P prosthetic replacement on 01/15/2020 by Dr. Erlinda Hong -  Chesley Noon, continue PT and OT, for therapeutic strengthening exercises -  Follow-up with orthopedics    Family/ staff Communication: Discussed plan of care with charge nurse.  Labs/tests ordered:  None  Goals of care:   Short-term rehab   Durenda Age, DNP, FNP-BC [facility] [phone] (Monday-Friday 8:00 a.m. - 5:00 p.m.) [phone] (after hours)

## 2020-02-21 LAB — BASIC METABOLIC PANEL
BUN: 29 — AB (ref 4–21)
CO2: 25 — AB (ref 13–22)
Chloride: 100 (ref 99–108)
Creatinine: 0.4 — AB (ref 0.5–1.1)
Glucose: 115
Potassium: 3.6 (ref 3.4–5.3)
Sodium: 144 (ref 137–147)

## 2020-02-21 LAB — COMPREHENSIVE METABOLIC PANEL
Calcium: 9.8 (ref 8.7–10.7)
GFR calc Af Amer: 90
GFR calc non Af Amer: 90

## 2020-02-24 ENCOUNTER — Encounter: Payer: Self-pay | Admitting: Adult Health

## 2020-02-24 ENCOUNTER — Non-Acute Institutional Stay (SKILLED_NURSING_FACILITY): Payer: Medicare Other | Admitting: Adult Health

## 2020-02-24 DIAGNOSIS — F0391 Unspecified dementia with behavioral disturbance: Secondary | ICD-10-CM

## 2020-02-24 DIAGNOSIS — I10 Essential (primary) hypertension: Secondary | ICD-10-CM | POA: Diagnosis not present

## 2020-02-24 DIAGNOSIS — R630 Anorexia: Secondary | ICD-10-CM

## 2020-02-24 DIAGNOSIS — S72001A Fracture of unspecified part of neck of right femur, initial encounter for closed fracture: Secondary | ICD-10-CM | POA: Diagnosis not present

## 2020-02-24 DIAGNOSIS — D649 Anemia, unspecified: Secondary | ICD-10-CM

## 2020-02-24 DIAGNOSIS — F339 Major depressive disorder, recurrent, unspecified: Secondary | ICD-10-CM

## 2020-02-24 DIAGNOSIS — H1031 Unspecified acute conjunctivitis, right eye: Secondary | ICD-10-CM

## 2020-02-24 DIAGNOSIS — F39 Unspecified mood [affective] disorder: Secondary | ICD-10-CM

## 2020-02-24 NOTE — Progress Notes (Signed)
Location:  [place] Room Number: 110-B Place of Service:  SNF (31) Provider:  Durenda Age, DNP, FNP-BC  Patient Care Team: Prince Solian, MD as PCP - General (Internal Medicine)  Extended Emergency Contact Information Primary Emergency Contact: Kaylyn Layer Mobile Phone: [phone] Relation: Daughter Secondary Emergency Contact: Shuke,Joyce Address: [address], [place] [address] [place] Phone: [phone] Relation: Daughter  Code Status:  DNR  Goals of care: Advanced Directive information Advanced Directives 02/24/2020  Does Patient Have a Medical Advance Directive? Yes  Type of Advance Directive Out of facility DNR (pink MOST or yellow form)  Does patient want to make changes to medical advance directive? No - Patient declined  Would patient like information on creating a medical advance directive? -  Pre-existing out of facility DNR order (yellow form or pink MOST form) Yellow form placed in chart (order not valid for inpatient use)     Chief Complaint  Patient presents with  . Medical Management of Chronic Issues    Routine short-term rehabilitation visit    HPI:  Pt is an 85 y.o. female seen today for medical management of chronic diseases.  She is a short-term rehabilitation resident of [facility].  She has a PMH of TIA, vascular dementia, essential hypertension, dyslipidemia and history of Covid in December 2020. She was seen in the room today. She has her eyes close but verbally responsive. She is S/P4 days Ciprofloxacin eye drops for right eye conjunctivitis. Right eye still has erythema, no dtainage nor crusting. Resident stated that she is not constipated. When her abdomen was touched, she said,"Don't touch my pussy! Get the h-ll out of here!" Her Depakote dosage was decreased on 02/08/20 due to being sleepy. Currently, she is having PT, OT and ST.     Past  Medical History:  Diagnosis Date  . Cancer ([place])   . High cholesterol   . Neuralgia facialis vera   . TIA (transient ischemic attack)    Past Surgical History:  Procedure Laterality Date  . ANTERIOR APPROACH HEMI HIP ARTHROPLASTY Right 01/15/2020   Procedure: ANTERIOR APPROACH HEMI HIP ARTHROPLASTY;  Surgeon: Leandrew Koyanagi, MD;  Location: [facility];  Service: Orthopedics;  Laterality: Right;    Allergies  Allergen Reactions  . Aspirin Nausea And Vomiting  . Penicillins Hives  . Sulfa Antibiotics Nausea And Vomiting  . Sulfamethoprim [Sulfamethoxazole-Trimethoprim] Other (See Comments)    Per [facility]    Outpatient Encounter Medications as of 02/24/2020  Medication Sig  . acetaminophen (TYLENOL) 325 MG tablet Take 650 mg by mouth in the morning and at bedtime.  Marland Kitchen acetaminophen (TYLENOL) 500 MG tablet Take 500 mg by mouth 2 (two) times daily as needed for moderate pain.   Marland Kitchen alum & mag hydroxide-simeth (MYLANTA) 076-226-33 MG/5ML suspension Take 30 mLs by mouth every 6 (six) hours as needed for indigestion or heartburn.  Marland Kitchen ascorbic acid (VITAMIN C) 500 MG tablet Take 500 mg by mouth 2 (two) times daily.  Marland Kitchen atorvastatin (LIPITOR) 40 MG tablet Take 40 mg by mouth at bedtime.   . cholecalciferol (VITAMIN D3) 25 MCG (1000 UNIT) tablet Take 1,000 Units by mouth daily.  . divalproex (DEPAKOTE) 125 MG DR tablet Take 125 mg by mouth 2 (two) times daily. FOR MOOD DISORDER (DO NOT CRUSH)  . donepezil (ARICEPT) 10 MG tablet Take 10 mg by mouth in the morning and at bedtime.  For Dementia  . escitalopram (LEXAPRO) 10 MG tablet Take 10 mg by mouth daily. FOR DEPRESSION  . ferrous sulfate 325 (65 FE) MG tablet Take 325 mg by mouth daily. FOR ANEMIA  . guaiFENesin (ROBITUSSIN) 100 MG/5ML liquid Take 200 mg by mouth 4 (four) times daily as needed for cough or congestion.  Marland Kitchen lactulose (CHRONULAC) 10 GM/15ML solution Take 20 g by mouth daily. For Constipation  . metoprolol succinate (TOPROL-XL) 25 MG 24 hr tablet  Take 25 mg by mouth daily.  . mirtazapine (REMERON) 7.5 MG tablet Take 7.5 mg by mouth at bedtime.  . Multiple Vitamin (MULTIVITAMIN WITH MINERALS) TABS tablet Take 1 tablet by mouth daily.  . Nutritional Supplements (NUTRITIONAL SUPPLEMENT PO) Take 120 mLs by mouth 3 (three) times daily. MedPass  . polyvinyl alcohol (LIQUIFILM TEARS) 1.4 % ophthalmic solution Place 2 drops into the right eye in the morning, at noon, and at bedtime.  . solifenacin (VESICARE) 5 MG tablet Take 5 mg by mouth daily.   . Zinc 50 MG TABS Take 50 mg by mouth in the morning and at bedtime.   No facility-administered encounter medications on file as of 02/24/2020.    Review of Systems  Unable to obtain due to dementia   Immunization History  Administered Date(s) Administered  . Moderna SARS-COVID-2 Vaccination 09/04/2019, 10/02/2019   Pertinent  Health Maintenance Due  Topic Date Due  . DEXA SCAN  Never done  . PNA vac Low Risk Adult (1 of 2 - PCV13) Never done  . INFLUENZA VACCINE  02/27/2020    Vitals:   02/24/20 1356  BP: 117/72  Pulse: 85  Resp: 18  Temp: 98.1 F (36.7 C)  TempSrc: Oral  Weight: 115 lb 9.6 oz (52.4 kg)  Height: 5\' 1"  (1.549 m)   Body mass index is 21.84 kg/m.  Physical Exam  GENERAL APPEARANCE: Well nourished. In no acute distress. Normal body habitus SKIN:  Right hip surgical wound is dry, no erythema MOUTH and THROAT: Lips are without lesions. Oral mucosa is moist and without lesions. Tongue is normal in shape, size, and color and without lesions RESPIRATORY: Breathing is even & unlabored, BS CTAB CARDIAC: RRR, no murmur,no extra heart sounds, no edema GI: Abdomen soft, normal BS, no masses, no tenderness EXTREMITIES:  Able to move X 4 extremities NEUROLOGICAL: There is no tremor. Speech is clear. Alert to self, disoriented to time and place. PSYCHIATRIC:  Agitated  Labs reviewed: Recent Labs    01/24/20 1722 01/24/20 1722 01/25/20 0033 01/25/20 0500  01/25/20 0500 01/30/20 0406 02/09/20 0000 02/21/20 0000  NA 141   < >  --  138   < > 140 144 144  K 3.1*   < >  --  4.4   < > 4.6 3.7 3.6  CL 103   < >  --  106   < > 103 104 100  CO2 29   < >  --  24   < > 27 27* 25*  GLUCOSE 148*  --   --  91  --  97  --   --   BUN 27*   < >  --  22   < > 21 37* 29*  CREATININE 0.67   < >  --  0.54   < > 0.61 0.6 0.4*  CALCIUM 8.2*   < >  --  7.3*   < > 9.0 9.7 9.8  MG  --   --  1.8 1.6*  --  1.9  --   --   PHOS  --   --  3.8 2.5  --   --   --   --    < > = values in this interval not displayed.   Recent Labs    01/24/20 1722 01/25/20 0500 01/30/20 0406  AST 43* 38 27  ALT 57* 45* 27  ALKPHOS 66 52 61  BILITOT 0.7 1.5* 0.8  PROT 5.5* 4.9* 6.2*  ALBUMIN 2.1* 1.9* 2.6*   Recent Labs    01/24/20 1722 01/25/20 0033 01/25/20 0500 01/25/20 1203 01/25/20 2351 01/25/20 2351 01/26/20 0450 01/30/20 0406 02/09/20 0000  WBC 7.2   < > 8.0   < > 9.4   < > 9.6 8.4 9.0  NEUTROABS 4.6  --  5.2  --   --   --   --  4.7  --   HGB 7.0*   < > 9.4*   < > 10.3*   < > 12.2 12.3 11.8*  HCT 22.7*   < > 28.5*   < > 31.3*   < > 35.2* 38.5 36  MCV 93.0   < > 88.5   < > 86.7  --  86.3 91.9  --   PLT 303   < > 232   < > 254   < > 232 254 190   < > = values in this interval not displayed.   Lab Results  Component Value Date   TSH 2.184 01/25/2020   Lab Results  Component Value Date   HGBA1C  09/21/2009    5.8 (NOTE) The ADA recommends the following therapeutic goal for glycemic control related to Hgb A1c measurement: Goal of therapy: <6.5 Hgb A1c  Reference: American Diabetes Association: Clinical Practice Recommendations 2010, Diabetes Care, 2010, 33: (Suppl  1).   Lab Results  Component Value Date   CHOL  09/22/2009    123        ATP III CLASSIFICATION:  <200     mg/dL   Desirable  200-239  mg/dL   Borderline High  >=240    mg/dL   High          HDL 34 (L) 09/22/2009   LDLCALC  09/22/2009    64        Total Cholesterol/HDL:CHD Risk Coronary  Heart Disease Risk Table                     Men   Women  1/2 Average Risk   3.4   3.3  Average Risk       5.0   4.4  2 X Average Risk   9.6   7.1  3 X Average Risk  23.4   11.0        Use the calculated Patient Ratio above and the CHD Risk Table to determine the patient's CHD Risk.        ATP III CLASSIFICATION (LDL):  <100     mg/dL   Optimal  100-129  mg/dL   Near or Above                    Optimal  130-159  mg/dL   Borderline  160-189  mg/dL   High  >190     mg/dL   Very High   TRIG 127 09/22/2009   CHOLHDL 3.6 09/22/2009    Significant Diagnostic Results in last 30 days:  XR HIP UNILAT W OR W/O PELVIS 2-3 VIEWS  RIGHT  Result Date: 02/14/2020 X-rays demonstrate a well-seated prosthesis without complication   Assessment/Plan  1. Essential hypertension -Stable, continue metoprolol succinate ER  2. Acute bacterial conjunctivitis of right eye - S/P Ciptofloxacin eye drops  -  No crusting but still has right sclera redness -  Will start Artificial tears  3. Anemia, unspecified type Lab Results  Component Value Date   HGB 11.8 (A) 02/09/2020   -Continue ferrous sulfate  4. Closed displaced fracture of right femoral neck (HCC) -S/P prosthetic replacement on 01/15/2020 by Dr. Erlinda Hong -Not on DVT prophylaxis due to concern for bleeding -Continue acetaminophen for pain -Follow-up with orthopedics  5. Poor appetite Wt Readings from Last 3 Encounters:  02/24/20 115 lb 9.6 oz (52.4 kg)  02/17/20 115 lb 9.6 oz (52.4 kg)  02/08/20 117 lb 6.4 oz (53.3 kg)   - stable, continue Remeron  6. Mood disorder ([place]) -Agitated, continue Depakote  7. Major depression, recurrent, chronic (HCC) -Continue Escitalopram  8. Dementia with behavioral disturbance, unspecified dementia type ([place]) - BIMS score 1/15, ranging in severe cognitive deficit, continue Aricept and supportive care -Fall precautions   Family/ staff Communication: Discussed plan of care with resident and charge  nurse.  Labs/tests ordered: None  Goals of care:   Short-term care   Durenda Age, DNP, FNP-BC [facility] [phone] (Monday-Friday 8:00 a.m. - 5:00 p.m.) [phone] (after hours)

## 2020-02-28 ENCOUNTER — Non-Acute Institutional Stay (SKILLED_NURSING_FACILITY): Payer: Medicare Other | Admitting: Internal Medicine

## 2020-02-28 ENCOUNTER — Encounter: Payer: Self-pay | Admitting: Internal Medicine

## 2020-02-28 DIAGNOSIS — E041 Nontoxic single thyroid nodule: Secondary | ICD-10-CM

## 2020-02-28 DIAGNOSIS — H15001 Unspecified scleritis, right eye: Secondary | ICD-10-CM | POA: Insufficient documentation

## 2020-02-28 DIAGNOSIS — S72001A Fracture of unspecified part of neck of right femur, initial encounter for closed fracture: Secondary | ICD-10-CM | POA: Diagnosis not present

## 2020-02-28 NOTE — Progress Notes (Signed)
   NURSING HOME LOCATION:  [facility] ROOM NUMBER:  110-B  CODE STATUS:  DNR  PCP:  Prince Solian, MD  [place] [id number]   This is a nursing facility follow up for specific acute issue of erythema of the OD.   Interim medical record and care since last [facility] visit was updated with review of diagnostic studies and change in clinical status since last visit were documented.  LSL:HTDSK date of onset is unclear. The [facility] NP prescribed Cipro topically which did not resolve the redness attributed to acute bacterial conjunctivitis. The patient is intolerant or allergic to penicillin and sulfa antibiotics. Urticaria was reported with penicillin.  Kaylyn Layer, HCPOA had requested that the thyroid nodule found incidentally not be biopsied.  No additional procedures or consultations were requested.  The [place] FNA by Interventional Radiology was canceled.  Orthopedic follow-up for the closed displaced fracture of the right femoral neck,S/P R THR, was completed 7/19.  Staples were removed at that visit.  Weightbearing as tolerated was to be as tolerated in PT/OT.Her HCPOA stated that it is unlikely the patient will be able to ambulate.  Follow-up was to be scheduled in 4 weeks with Dr. Erlinda Hong.  Review of systems: Dementia preventing any meaningful history being obtained and hindered my attempt to examine her.  Physical exam:  Pertinent or positive findings: She appears suboptimally nourished with sunken cheeks.  She is edentulous.  She initially would not open her eyes for evaluation.  When I attempted to passively open the eye she struck me with her fists.Intermittently she did open her eyes slightly.  Scleritis was present on the right.  Hemorrhagic etiology cannot be ruled out.  Clear drainage was noted medially.  There was slight crusting laterally.  When I asked her to count fingers with the right eye she replied:" 5 fingers", even though she kept the eye  closed.  Heart sounds are distant and slightly tachycardic.When she calmed down she was noted to exhibit athetoid movements of the tongue with recurrent tongue thrusting outward.  General appearance: no acute distress, increased work of breathing is present.   Lymphatic: No lymphadenopathy about the head, neck, axilla. Eyes: There is no scleral icterus. Ears:  External ear exam shows no significant lesions or deformities.   Nose:  External nasal examination shows no deformity or inflammation. Nasal mucosa are pink and moist without lesions, exudates Neck:  No thyromegaly, masses, tenderness noted.    Heart:  No murmur, click, rub .  Lungs: Chest clear to auscultation anteriorly without wheezes, rhonchi, rales, rubs. Extremities:  No cyanosis, clubbing, edema  Neurologic exam :Balance, Rhomberg, finger to nose testing could not be completed due to clinical state Skin: Warm & dry w/o tenting. No significant lesions or rash.  See summary under each active problem in the Problem List with associated updated therapeutic plan

## 2020-02-28 NOTE — Assessment & Plan Note (Signed)
No other bleeding dyscrasias have been reported.  Labs revealed slight postop anemia.  Platelet counts have been normal. Erythromycin ophthalmic ointment 0.5% will be administered into the right eye 3 times daily x7 days.

## 2020-02-28 NOTE — Assessment & Plan Note (Addendum)
Janet [name], HCPOA requested the FNA by IR be canceled.  No further procedures or consultations requested by her.

## 2020-02-28 NOTE — Assessment & Plan Note (Signed)
Orthopedic follow-up 7/19 by Dr. Erlinda Hong.  Jodell Cipro removed.  Weightbearing as tolerated recommended.  HCPOA doubts patient will be able to ambulate.

## 2020-02-28 NOTE — Patient Instructions (Signed)
See assessment and plan under each diagnosis in the problem list and acutely for this visit 

## 2020-03-03 ENCOUNTER — Encounter: Payer: Self-pay | Admitting: Adult Health

## 2020-03-03 ENCOUNTER — Non-Acute Institutional Stay (SKILLED_NURSING_FACILITY): Payer: Medicare Other | Admitting: Adult Health

## 2020-03-03 DIAGNOSIS — F39 Unspecified mood [affective] disorder: Secondary | ICD-10-CM | POA: Diagnosis not present

## 2020-03-03 DIAGNOSIS — I10 Essential (primary) hypertension: Secondary | ICD-10-CM

## 2020-03-03 DIAGNOSIS — F339 Major depressive disorder, recurrent, unspecified: Secondary | ICD-10-CM

## 2020-03-03 DIAGNOSIS — R630 Anorexia: Secondary | ICD-10-CM

## 2020-03-03 DIAGNOSIS — S72001A Fracture of unspecified part of neck of right femur, initial encounter for closed fracture: Secondary | ICD-10-CM

## 2020-03-03 DIAGNOSIS — H15001 Unspecified scleritis, right eye: Secondary | ICD-10-CM | POA: Diagnosis not present

## 2020-03-03 DIAGNOSIS — F0391 Unspecified dementia with behavioral disturbance: Secondary | ICD-10-CM

## 2020-03-03 NOTE — Progress Notes (Signed)
Location:  [place] Room Number: 110-B Place of Service:  SNF (31) Provider:  Durenda Age, DNP, FNP-BC  Patient Care Team: Prince Solian, MD as PCP - General (Internal Medicine)  Extended Emergency Contact Information Primary Emergency Contact: Kaylyn Layer Mobile Phone: [phone] Relation: Daughter Secondary Emergency Contact: Shuke,Joyce Address: [place], [place] [address] [place] Phone: [phone] Relation: Daughter  Code Status:  DNR  Goals of care: Advanced Directive information Advanced Directives 02/28/2020  Does Patient Have a Medical Advance Directive? Yes  Type of Advance Directive Out of facility DNR (pink MOST or yellow form)  Does patient want to make changes to medical advance directive? No - Patient declined  Would patient like information on creating a medical advance directive? -  Pre-existing out of facility DNR order (yellow form or pink MOST form) -     Chief Complaint  Patient presents with  . Medical Management of Chronic Issues    Routine short-term rehabilitation visit    HPI:  Pt is an 85 y.o. female seen today for medical management of chronic diseases.  She is a long-term care resident of [facility].  She has a PMH of TIA, vascular dementia, essential hypertension, dyslipidemia and history of Covid-19 in December 2020.  She was seen in her room today. Her eyes were closed but verbally responsive. When requested to open her eyes, she opens them slightly and still noted to have redness on her sclera.  No eye discharge noted.  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 No report of fever no chills. SBPs ranging from 117-142.  Regarding the takes metoprolol succinate ER for hypertension.   Past Medical History:  Diagnosis Date  . Cancer ([place])   . High cholesterol   . Neuralgia facialis vera   .  TIA (transient ischemic attack)    Past Surgical History:  Procedure Laterality Date  . ANTERIOR APPROACH HEMI HIP ARTHROPLASTY Right 01/15/2020   Procedure: ANTERIOR APPROACH HEMI HIP ARTHROPLASTY;  Surgeon: Leandrew Koyanagi, MD;  Location: [facility];  Service: Orthopedics;  Laterality: Right;    Allergies  Allergen Reactions  . Aspirin Nausea And Vomiting  . Penicillins Hives  . Sulfa Antibiotics Nausea And Vomiting  . Sulfamethoprim [Sulfamethoxazole-Trimethoprim] Other (See Comments)    Per [facility]    Outpatient Encounter Medications as of 03/03/2020  Medication Sig  . acetaminophen (TYLENOL) 325 MG tablet Take 650 mg by mouth in the morning and at bedtime.  Marland Kitchen acetaminophen (TYLENOL) 500 MG tablet Take 500 mg by mouth 2 (two) times daily as needed for moderate pain.   Marland Kitchen alum & mag hydroxide-simeth (MYLANTA) [phone] MG/5ML suspension Take 30 mLs by mouth every 6 (six) hours as needed for indigestion or heartburn.  Marland Kitchen ascorbic acid (VITAMIN C) 500 MG tablet Take 500 mg by mouth 2 (two) times daily.  Marland Kitchen atorvastatin (LIPITOR) 40 MG tablet Take 40 mg by mouth at bedtime.   . cholecalciferol (VITAMIN D3) 25 MCG (1000 UNIT) tablet Take 1,000 Units by mouth daily.  . divalproex (DEPAKOTE) 125 MG DR tablet Take 125 mg by mouth 2 (two) times daily. FOR MOOD DISORDER (DO NOT CRUSH)  . donepezil (ARICEPT) 10 MG tablet Take 10 mg by mouth in the morning and at bedtime. For Dementia  . erythromycin ophthalmic ointment Place 1 application into the right eye 3 (three)  times daily.  Marland Kitchen escitalopram (LEXAPRO) 10 MG tablet Take 10 mg by mouth daily. FOR DEPRESSION  . ferrous sulfate 325 (65 FE) MG tablet Take 325 mg by mouth daily. FOR ANEMIA  . guaiFENesin (ROBITUSSIN) 100 MG/5ML liquid Take 200 mg by mouth 4 (four) times daily as needed for cough or congestion.  Marland Kitchen lactulose (CHRONULAC) 10 GM/15ML solution Take 20 g by mouth daily. For Constipation  . metoprolol succinate (TOPROL-XL) 25 MG 24 hr tablet Take 25  mg by mouth daily.  . mirtazapine (REMERON) 7.5 MG tablet Take 7.5 mg by mouth at bedtime.  . Multiple Vitamin (MULTIVITAMIN WITH MINERALS) TABS tablet Take 1 tablet by mouth daily.  . Nutritional Supplements (NUTRITIONAL SUPPLEMENT PO) Take 120 mLs by mouth 3 (three) times daily. MedPass  . polyvinyl alcohol (LIQUIFILM TEARS) 1.4 % ophthalmic solution Place 2 drops into the right eye in the morning, at noon, and at bedtime.  . solifenacin (VESICARE) 5 MG tablet Take 5 mg by mouth daily.   . Zinc 50 MG TABS Take 50 mg by mouth in the morning and at bedtime.   No facility-administered encounter medications on file as of 03/03/2020.    Review of Systems unable to obtain due to dementia   Immunization History  Administered Date(s) Administered  . Moderna SARS-COVID-2 Vaccination 09/04/2019, 10/02/2019   Pertinent  Health Maintenance Due  Topic Date Due  . DEXA SCAN  Never done  . PNA vac Low Risk Adult (1 of 2 - PCV13) Never done  . INFLUENZA VACCINE  02/27/2020    Vitals:   03/03/20 1401  BP: 120/82  Pulse: 68  Resp: 20  Temp: (!) 97.5 F (36.4 C)  TempSrc: Oral  Weight: 115 lb 9.6 oz (52.4 kg)  Height: 5\' 1"  (1.549 m)   Body mass index is 21.84 kg/m.  Physical Exam  GENERAL APPEARANCE:  In no acute distress. Normal body habitus SKIN:  Skin is warm and dry.  EYES:  Right sclera with erythema MOUTH and THROAT: Lips are without lesions. Oral mucosa is moist and without lesions.  RESPIRATORY: Breathing is even & unlabored, BS CTAB CARDIAC: RRR, no murmur,no extra heart sounds, no edema GI: Abdomen soft, normal BS, no masses, no tenderness EXTREMITIES: Able to move x4 extremities NEUROLOGICAL: There is no tremor. Speech is clear.  Alert to self, disoriented to time and place. PSYCHIATRIC:  Affect and behavior are appropriate  Labs reviewed: Recent Labs    01/24/20 1722 01/24/20 1722 01/25/20 0033 01/25/20 0500 01/25/20 0500 01/30/20 0406 02/09/20 0000  02/21/20 0000  NA 141   < >  --  138   < > 140 144 144  K 3.1*   < >  --  4.4   < > 4.6 3.7 3.6  CL 103   < >  --  106   < > 103 104 100  CO2 29   < >  --  24   < > 27 27* 25*  GLUCOSE 148*  --   --  91  --  97  --   --   BUN 27*   < >  --  22   < > 21 37* 29*  CREATININE 0.67   < >  --  0.54   < > 0.61 0.6 0.4*  CALCIUM 8.2*   < >  --  7.3*   < > 9.0 9.7 9.8  MG  --   --  1.8 1.6*  --  1.9  --   --  PHOS  --   --  3.8 2.5  --   --   --   --    < > = values in this interval not displayed.   Recent Labs    01/24/20 1722 01/25/20 0500 01/30/20 0406  AST 43* 38 27  ALT 57* 45* 27  ALKPHOS 66 52 61  BILITOT 0.7 1.5* 0.8  PROT 5.5* 4.9* 6.2*  ALBUMIN 2.1* 1.9* 2.6*   Recent Labs    01/24/20 1722 01/25/20 0033 01/25/20 0500 01/25/20 1203 01/25/20 2351 01/25/20 2351 01/26/20 0450 01/30/20 0406 02/09/20 0000  WBC 7.2   < > 8.0   < > 9.4   < > 9.6 8.4 9.0  NEUTROABS 4.6  --  5.2  --   --   --   --  4.7  --   HGB 7.0*   < > 9.4*   < > 10.3*   < > 12.2 12.3 11.8*  HCT 22.7*   < > 28.5*   < > 31.3*   < > 35.2* 38.5 36  MCV 93.0   < > 88.5   < > 86.7  --  86.3 91.9  --   PLT 303   < > 232   < > 254   < > 232 254 190   < > = values in this interval not displayed.   Lab Results  Component Value Date   TSH 2.184 01/25/2020   Lab Results  Component Value Date   HGBA1C  09/21/2009    5.8 (NOTE) The ADA recommends the following therapeutic goal for glycemic control related to Hgb A1c measurement: Goal of therapy: <6.5 Hgb A1c  Reference: American Diabetes Association: Clinical Practice Recommendations 2010, Diabetes Care, 2010, 33: (Suppl  1).   Lab Results  Component Value Date   CHOL  09/22/2009    123        ATP III CLASSIFICATION:  <200     mg/dL   Desirable  200-239  mg/dL   Borderline High  >=240    mg/dL   High          HDL 34 (L) 09/22/2009   LDLCALC  09/22/2009    64        Total Cholesterol/HDL:CHD Risk Coronary Heart Disease Risk Table                      Men   Women  1/2 Average Risk   3.4   3.3  Average Risk       5.0   4.4  2 X Average Risk   9.6   7.1  3 X Average Risk  23.4   11.0        Use the calculated Patient Ratio above and the CHD Risk Table to determine the patient's CHD Risk.        ATP III CLASSIFICATION (LDL):  <100     mg/dL   Optimal  100-129  mg/dL   Near or Above                    Optimal  130-159  mg/dL   Borderline  160-189  mg/dL   High  >190     mg/dL   Very High   TRIG 127 09/22/2009   CHOLHDL 3.6 09/22/2009    Significant Diagnostic Results in last 30 days:  XR HIP UNILAT W OR W/O PELVIS 2-3 VIEWS RIGHT  Result Date: 02/14/2020 X-rays demonstrate a  well-seated prosthesis without complication   Assessment/Plan  1. Closed displaced fracture of right femoral neck ([place]) - S/P prosthetic replacement on 01/15/2020 -Continues to have PT and OT, for therapeutic strengthening exercises  -Continue PRN acetaminophen for pain  2. Essential hypertension -Stable, continue metoprolol succinate ER  3. Scleritis of right eye -Continue erythromycin eye ointment and eye care daily  4. Mood disorder (HCC) -Mood is a stable, continue divalproex DR  5. Poor appetite Wt Readings from Last 3 Encounters:  03/07/20 115 lb 9.6 oz (52.4 kg)  03/03/20 115 lb 9.6 oz (52.4 kg)  02/28/20 115 lb 9.6 oz (52.4 kg)   -Weight is is stable, continue mirtazapine -Continue nutritional supplements such as med Pass  6. Major depression, recurrent, chronic (HCC) -Stable, continue Escitalopram  7. Dementia with behavioral disturbance, unspecified dementia type ([place]) -Continue donepezil -Continue supportive care     Family/ staff Communication: Discussed plan of care with charge nurse.  Labs/tests ordered: None  Goals of care:   Long-term care   Durenda Age, DNP, MSN, FNP-BC [facility] [phone] (Monday-Friday 8:00 a.m. - 5:00 p.m.) [phone] (after hours)

## 2020-03-07 ENCOUNTER — Encounter: Payer: Self-pay | Admitting: Internal Medicine

## 2020-03-07 ENCOUNTER — Non-Acute Institutional Stay (SKILLED_NURSING_FACILITY): Payer: Medicare Other | Admitting: Internal Medicine

## 2020-03-07 DIAGNOSIS — H15001 Unspecified scleritis, right eye: Secondary | ICD-10-CM | POA: Diagnosis not present

## 2020-03-07 DIAGNOSIS — F0390 Unspecified dementia without behavioral disturbance: Secondary | ICD-10-CM | POA: Diagnosis not present

## 2020-03-07 NOTE — Progress Notes (Signed)
   NURSING HOME LOCATION:  [facility] ROOM NUMBER:  110-B  CODE STATUS:  DNR  PCP:  Prince Solian, MD  [place] [id number]   This is a nursing facility follow up for specific acute issue of determination of competency.  Additionally the hemorrhagic scleritis recently treated with erythromycin was to be reassessed.  Interim medical record and care since last [facility] visit was updated with review of diagnostic studies and change in clinical status since last visit were documented.  HPI: She is a permanent resident of facility with diagnoses of history of TIA, dyslipidemia, advanced dementia, and prior history of malignancy. She was admitted to this facility after sustaining a right hip fracture in a fall at another facility. She is on Depakote, donepezil, escitalopram, and mirtazapine.  Review of systems: Dementia precluded obtaining any history.  The patient would not answer questions and could not follow commands.  Staff reports she is total care.  Physical exam:  Pertinent or positive findings: She appears her stated age, chronically ill and suboptimally nourished.  She stated that she was "hungry".  On 2 occasions she cried out "do not hurt me".  She would not open her eyes on command but I was able to complete a limited exam of the right eye.  There has been marked improvement in the hemorrhagic scleritis previously noted.  No purulence was present.  She is edentulous with facial muscle atrophy.  She has intermittent athetoid activity of the tongue.  Breath sounds are decreased.  Pedal pulses are decreased.  There are isolated toenail deformities.  She also has mixed arthritic changes of the hands.  She would not oppose my hands to test strength.  She could not hold up the correct numbers of fingers on either hand.  Heel wound  Dressed.  General appearance:  no acute distress, increased work of breathing is present.   Lymphatic: No lymphadenopathy  about the head, neck, axilla. Eyes: No conjunctival inflammation or lid edema is present. There is no scleral icterus. Ears:  External ear exam shows no significant lesions or deformities.   Nose:  External nasal examination shows no deformity or inflammation. Nasal mucosa are pink and moist without lesions, exudates Neck:  No thyromegaly, masses, tenderness noted.    Heart:  Normal rate and regular rhythm. S1 and S2 normal without gallop, murmur, click, rub .  Lungs:  without wheezes, rhonchi, rales, rubs. Abdomen: Bowel sounds are normal. Abdomen is soft and nontender with no organomegaly, hernias, masses. GU: Deferred  Extremities:  No cyanosis, clubbing, edema  Neurologic exam :Balance, Rhomberg, finger to nose testing could not be completed due to clinical state Skin: Warm & dry w/o tenting. No significant rash.  See summary under each active problem in the Problem List with associated updated therapeutic plan

## 2020-03-07 NOTE — Patient Instructions (Signed)
See assessment and plan under each diagnosis in the problem list and acutely for this visit 

## 2020-03-07 NOTE — Assessment & Plan Note (Addendum)
Hemorrhagic scleritis appears much improved clinically.  Erythromycin ointment will be continued for an additional 3 days for a total 10-day course.

## 2020-03-07 NOTE — Assessment & Plan Note (Addendum)
This patient has severe dementia; unable to provide any history or follow commands.  She is not oriented to time, place, or person.  She is incapable of managing her affairs. Formal MMSE cannot be completed.

## 2020-03-14 ENCOUNTER — Ambulatory Visit (INDEPENDENT_AMBULATORY_CARE_PROVIDER_SITE_OTHER): Payer: Medicare Other

## 2020-03-14 ENCOUNTER — Encounter: Payer: Self-pay | Admitting: Orthopaedic Surgery

## 2020-03-14 ENCOUNTER — Ambulatory Visit (INDEPENDENT_AMBULATORY_CARE_PROVIDER_SITE_OTHER): Payer: Medicare Other | Admitting: Orthopaedic Surgery

## 2020-03-14 DIAGNOSIS — Z96641 Presence of right artificial hip joint: Secondary | ICD-10-CM | POA: Diagnosis not present

## 2020-03-14 NOTE — Progress Notes (Signed)
   Post-Op Visit Note   Patient: Janet [name]           Date of Birth: 1935/04/27           MRN: [id number] Visit Date: 03/14/2020 PCP: Prince Solian, MD   Assessment & Plan:  Chief Complaint:  Chief Complaint  Patient presents with  . Right Hip - Pain   Visit Diagnoses:  1. S/P hip replacement, right     Plan: Patient is a pleasant 85 year old who comes in today via stretcher in with her daughter.  She is living at [facility].  She is 2 months out right hip hemiarthroplasty 01/15/2020.  She appears to be doing well.  She has no pain with rotation of the hip.  She is neurovascular intact distally.  At this point, she will continue with therapy if able to.  Sounds like she mainly just sits in her bed and they help her set up at times.  She will follow-up with [place] as needed.  Call with concerns or questions.  Follow-Up Instructions: Return if symptoms worsen or fail to improve.   Orders:  Orders Placed This Encounter  Procedures  . XR HIP UNILAT W OR W/O PELVIS 2-3 VIEWS RIGHT   No orders of the defined types were placed in this encounter.   Imaging: XR HIP UNILAT W OR W/O PELVIS 2-3 VIEWS RIGHT  Result Date: 03/14/2020 Well-seated prosthesis without complication   PMFS History: Patient Active Problem List   Diagnosis Date Noted  . Scleritis of right eye 02/28/2020  . Anemia 01/25/2020  . Hypokalemia 01/24/2020  . Symptomatic anemia 01/24/2020  . Elevated LFTs 01/24/2020  . Hypoalbuminemia 01/24/2020  . Prolonged QT interval 01/24/2020  . Acute blood loss as cause of postoperative anemia 01/21/2020  . Closed displaced fracture of right femoral neck ([place]) 01/14/2020  . Thyroid nodule 01/14/2020  . Thrombocytopenia ([place]) 01/14/2020  . Dementia ([place]) 01/14/2020  . TIA (transient ischemic attack)   . High cholesterol   . Neuralgia facialis vera    Past Medical History:  Diagnosis Date  . Cancer ([place])   . High cholesterol   . Neuralgia facialis vera   . TIA  (transient ischemic attack)     Family History  Problem Relation Age of Onset  . Hypertension Other   . Diabetes Neg Hx     Past Surgical History:  Procedure Laterality Date  . ANTERIOR APPROACH HEMI HIP ARTHROPLASTY Right 01/15/2020   Procedure: ANTERIOR APPROACH HEMI HIP ARTHROPLASTY;  Surgeon: Leandrew Koyanagi, MD;  Location: [facility];  Service: Orthopedics;  Laterality: Right;   Social History   Occupational History  . Not on file  Tobacco Use  . Smoking status: Never Smoker  . Smokeless tobacco: Never Used  Substance and Sexual Activity  . Alcohol use: No  . Drug use: No  . Sexual activity: Not on file

## 2020-05-30 ENCOUNTER — Non-Acute Institutional Stay (SKILLED_NURSING_FACILITY): Payer: Medicare Other | Admitting: Adult Health

## 2020-05-30 ENCOUNTER — Encounter: Payer: Self-pay | Admitting: Adult Health

## 2020-05-30 DIAGNOSIS — R627 Adult failure to thrive: Secondary | ICD-10-CM | POA: Diagnosis not present

## 2020-05-30 DIAGNOSIS — D509 Iron deficiency anemia, unspecified: Secondary | ICD-10-CM

## 2020-05-30 DIAGNOSIS — R630 Anorexia: Secondary | ICD-10-CM | POA: Diagnosis not present

## 2020-05-30 DIAGNOSIS — I10 Essential (primary) hypertension: Secondary | ICD-10-CM | POA: Diagnosis not present

## 2020-05-30 NOTE — Progress Notes (Signed)
Location:  [place] Room Number: 227-B Place of Service:  SNF (31) Provider:  Durenda Age, DNP, FNP-BC  Patient Care Team: Prince Solian, MD as PCP - General (Internal Medicine)  Extended Emergency Contact Information Primary Emergency Contact: Kaylyn Layer Mobile Phone: [phone] Relation: Daughter Secondary Emergency Contact: Shuke,Joyce Address: [place], [place] [address] [place] Phone: [phone] Relation: Daughter  Code Status:  DNR  Goals of care: Advanced Directive information Advanced Directives 05/31/2020  Does Patient Have a Medical Advance Directive? Yes  Type of Advance Directive -  Does patient want to make changes to medical advance directive? No - Patient declined  Would patient like information on creating a medical advance directive? -  Pre-existing out of facility DNR order (yellow form or pink MOST form) -     Chief Complaint  Patient presents with  . Medical Management of Chronic Issues    Routine [facility] visit    HPI:  Pt is a 85 y.o. female seen today for medical management of chronic diseases.  She is a long-term care resident of [facility].  She is being followed by hospice.  She has a PMH of TIA, vascular dementia, essential hypertension, dyslipidemia and history of COVID-19 in 12/20. She was seen in her room today. Her eyes were closed but occasionally opens eyes and talked. She said," Prretty soon, I'll be gone. It's almost lunch time. Just waking up." She is confused X 3. SBPs ranging from 108 to 136, with outlier 99. She takes Metoprolol succinate ER 25 mg daily for hypertension. Latest hgb 11.8, 02/09/20. She takes Ferosul 325 mg daily for anemia.   Past Medical History:  Diagnosis Date  . Cancer ([place])   . High cholesterol   . Neuralgia facialis vera   . TIA (transient ischemic attack)    Past Surgical History:  Procedure  Laterality Date  . ANTERIOR APPROACH HEMI HIP ARTHROPLASTY Right 01/15/2020   Procedure: ANTERIOR APPROACH HEMI HIP ARTHROPLASTY;  Surgeon: Leandrew Koyanagi, MD;  Location: [facility];  Service: Orthopedics;  Laterality: Right;    Allergies  Allergen Reactions  . Aspirin Nausea And Vomiting  . Penicillins Hives  . Sulfa Antibiotics Nausea And Vomiting  . Sulfamethoprim [Sulfamethoxazole-Trimethoprim] Other (See Comments)    Per [facility]    Outpatient Encounter Medications as of 05/30/2020  Medication Sig  . acetaminophen (TYLENOL) 325 MG tablet Take 650 mg by mouth in the morning, at noon, and at bedtime.   Marland Kitchen acetaminophen (TYLENOL) 500 MG tablet Take 500 mg by mouth 2 (two) times daily as needed for moderate pain.   Marland Kitchen alum & mag hydroxide-simeth (MYLANTA) 509-326-71 MG/5ML suspension Take 30 mLs by mouth every 6 (six) hours as needed for indigestion or heartburn.  Marland Kitchen ascorbic acid (VITAMIN C) 500 MG tablet Take 500 mg by mouth 2 (two) times daily.  . Cholecalciferol (D3-50) 1.25 MG (50000 UT) capsule Take 50,000 Units by mouth every 30 (thirty) days. On the 2nd of each month  . divalproex (DEPAKOTE) 125 MG DR tablet Take 125 mg by mouth 2 (two) times daily. FOR MOOD DISORDER (DO NOT CRUSH)  . escitalopram (LEXAPRO) 10 MG tablet Take 10 mg by mouth daily. FOR DEPRESSION  . ferrous sulfate 325 (65 FE) MG tablet Take 325 mg by mouth daily. FOR ANEMIA  . guaiFENesin (ROBITUSSIN) 100 MG/5ML liquid Take 200 mg by mouth 4 (four)  times daily as needed for cough or congestion.  Marland Kitchen lactulose (CHRONULAC) 10 GM/15ML solution Take 20 g by mouth daily. For Constipation  . metoprolol succinate (TOPROL-XL) 25 MG 24 hr tablet Take 25 mg by mouth daily.  . mirtazapine (REMERON) 7.5 MG tablet Take 7.5 mg by mouth at bedtime.  . Multiple Vitamin (MULTIVITAMIN WITH MINERALS) TABS tablet Take 1 tablet by mouth daily.  . Nutritional Supplements (NUTRITIONAL SUPPLEMENT PO) Take 120 mLs by mouth 3 (three) times daily. MedPass    . Nutritional Supplements (NUTRITIONAL SUPPLEMENT PO) Take 1 each by mouth in the morning, at noon, and at bedtime. Magic Cup  . polyvinyl alcohol (LIQUIFILM TEARS) 1.4 % ophthalmic solution Place 2 drops into the right eye in the morning, at noon, and at bedtime.  . [DISCONTINUED] atorvastatin (LIPITOR) 40 MG tablet Take 40 mg by mouth at bedtime.   . [DISCONTINUED] cholecalciferol (VITAMIN D3) 25 MCG (1000 UNIT) tablet Take 1,000 Units by mouth daily.  . [DISCONTINUED] donepezil (ARICEPT) 10 MG tablet Take 10 mg by mouth in the morning and at bedtime. For Dementia  . [DISCONTINUED] solifenacin (VESICARE) 5 MG tablet Take 5 mg by mouth daily.   . [DISCONTINUED] Zinc 50 MG TABS Take 50 mg by mouth in the morning and at bedtime.   No facility-administered encounter medications on file as of 05/30/2020.    Review of Systems  Unable to obtain due to dementia   Immunization History  Administered Date(s) Administered  . Influenza-Unspecified 05/19/2020  . Moderna SARS-COVID-2 Vaccination 09/04/2019, 10/02/2019   Pertinent  Health Maintenance Due  Topic Date Due  . DEXA SCAN  Never done  . PNA vac Low Risk Adult (1 of 2 - PCV13) Never done  . INFLUENZA VACCINE  Completed    Physical Exam  GENERAL APPEARANCE:  In no acute distress. Cachectic. SKIN:  Skin is warm and dry.  MOUTH and THROAT: Lips are without lesions. Oral mucosa is moist and without lesions.  RESPIRATORY: Breathing is even & unlabored, BS CTAB CARDIAC: RRR, no murmur,no extra heart sounds, no edema GI: Abdomen soft, normal BS, no masses, no tenderness EXTREMITIES:  Able to move X 4 extremities NEUROLOGICAL: There is no tremor. Speech is clear. Confused X 3. PSYCHIATRIC:  Affect and behavior are appropriate  Labs reviewed: Recent Labs    01/24/20 1722 01/24/20 1722 01/25/20 0033 01/25/20 0500 01/25/20 0500 01/30/20 0406 02/09/20 0000 02/21/20 0000  NA 141   < >  --  138   < > 140 144 144  K 3.1*   < >  --   4.4   < > 4.6 3.7 3.6  CL 103   < >  --  106   < > 103 104 100  CO2 29   < >  --  24   < > 27 27* 25*  GLUCOSE 148*  --   --  91  --  97  --   --   BUN 27*   < >  --  22   < > 21 37* 29*  CREATININE 0.67   < >  --  0.54   < > 0.61 0.6 0.4*  CALCIUM 8.2*   < >  --  7.3*   < > 9.0 9.7 9.8  MG  --   --  1.8 1.6*  --  1.9  --   --   PHOS  --   --  3.8 2.5  --   --   --   --    < > =  values in this interval not displayed.   Recent Labs    01/24/20 1722 01/25/20 0500 01/30/20 0406  AST 43* 38 27  ALT 57* 45* 27  ALKPHOS 66 52 61  BILITOT 0.7 1.5* 0.8  PROT 5.5* 4.9* 6.2*  ALBUMIN 2.1* 1.9* 2.6*   Recent Labs    01/24/20 1722 01/25/20 0033 01/25/20 0500 01/25/20 1203 01/25/20 2351 01/25/20 2351 01/26/20 0450 01/30/20 0406 02/09/20 0000  WBC 7.2   < > 8.0   < > 9.4   < > 9.6 8.4 9.0  NEUTROABS 4.6  --  5.2  --   --   --   --  4.7  --   HGB 7.0*   < > 9.4*   < > 10.3*   < > 12.2 12.3 11.8*  HCT 22.7*   < > 28.5*   < > 31.3*   < > 35.2* 38.5 36  MCV 93.0   < > 88.5   < > 86.7  --  86.3 91.9  --   PLT 303   < > 232   < > 254   < > 232 254 190   < > = values in this interval not displayed.   Lab Results  Component Value Date   TSH 2.184 01/25/2020   Lab Results  Component Value Date   HGBA1C  09/21/2009    5.8 (NOTE) The ADA recommends the following therapeutic goal for glycemic control related to Hgb A1c measurement: Goal of therapy: <6.5 Hgb A1c  Reference: American Diabetes Association: Clinical Practice Recommendations 2010, Diabetes Care, 2010, 33: (Suppl  1).   Lab Results  Component Value Date   CHOL  09/22/2009    123        ATP III CLASSIFICATION:  <200     mg/dL   Desirable  200-239  mg/dL   Borderline High  >=240    mg/dL   High          HDL 34 (L) 09/22/2009   LDLCALC  09/22/2009    64        Total Cholesterol/HDL:CHD Risk Coronary Heart Disease Risk Table                     Men   Women  1/2 Average Risk   3.4   3.3  Average Risk       5.0   4.4   2 X Average Risk   9.6   7.1  3 X Average Risk  23.4   11.0        Use the calculated Patient Ratio above and the CHD Risk Table to determine the patient's CHD Risk.        ATP III CLASSIFICATION (LDL):  <100     mg/dL   Optimal  100-129  mg/dL   Near or Above                    Optimal  130-159  mg/dL   Borderline  160-189  mg/dL   High  >190     mg/dL   Very High   TRIG 127 09/22/2009   CHOLHDL 3.6 09/22/2009    Assessment/Plan  1. Iron deficiency anemia, unspecified iron deficiency anemia type Lab Results  Component Value Date   HGB 11.8 (A) 02/09/2020   -  Stable, continue FeSO4   2. Essential hypertension -Stable, continue metoprolol succinate ER -Monitor BPs  3. Poor appetite Wt Readings from Last 3  Encounters:  05/30/20 81 lb 9.6 oz (37 kg)  03/07/20 115 lb 9.6 oz (52.4 kg)  03/03/20 115 lb 9.6 oz (52.4 kg)   -Continue mirtazapine to increase appetite and Magic cup for supplementation  4. Failure to thrive in adult -Currently followed by hospice -Continue supplementation -Continue supportive care      Family/ staff Communication: Discussed plan of care with charge nurse.  Labs/tests ordered: None  Goals of care:   Long-term care/hospice  Durenda Age, DNP, MSN, FNP-BC [place] [phone] (Monday-Friday 8:00 a.m. - 5:00 p.m.) [phone] (after hours)

## 2020-05-31 ENCOUNTER — Encounter: Payer: Self-pay | Admitting: Adult Health

## 2020-07-13 ENCOUNTER — Non-Acute Institutional Stay (SKILLED_NURSING_FACILITY): Payer: Medicare Other | Admitting: Adult Health

## 2020-07-13 ENCOUNTER — Encounter: Payer: Self-pay | Admitting: Adult Health

## 2020-07-13 DIAGNOSIS — R630 Anorexia: Secondary | ICD-10-CM | POA: Diagnosis not present

## 2020-07-13 DIAGNOSIS — F39 Unspecified mood [affective] disorder: Secondary | ICD-10-CM | POA: Diagnosis not present

## 2020-07-13 DIAGNOSIS — F0391 Unspecified dementia with behavioral disturbance: Secondary | ICD-10-CM

## 2020-07-13 DIAGNOSIS — I10 Essential (primary) hypertension: Secondary | ICD-10-CM | POA: Diagnosis not present

## 2020-07-13 DIAGNOSIS — F339 Major depressive disorder, recurrent, unspecified: Secondary | ICD-10-CM | POA: Diagnosis not present

## 2020-07-13 NOTE — Progress Notes (Signed)
Location:  [facility] Room Number: 227-B Place of Service:  SNF (31) Provider:  Durenda Age, DNP, FNP-BC  Patient Care Team: Hendricks Limes, MD as PCP - General (Internal Medicine) Rehab, [facility] ([place])  Extended Emergency Contact Information Primary Emergency Contact: Kaylyn Layer Mobile Phone: [phone] Relation: Daughter Secondary Emergency Contact: Shuke,Joyce Address: [place], [place] [address] [place] Phone: [phone] Relation: Daughter  Code Status:  DNR  Goals of care: Advanced Directive information Advanced Directives 07/13/2020  Does Patient Have a Medical Advance Directive? Yes  Type of Advance Directive Out of facility DNR (pink MOST or yellow form)  Does patient want to make changes to medical advance directive? No - Patient declined  Would patient like information on creating a medical advance directive? -  Pre-existing out of facility DNR order (yellow form or pink MOST form) Yellow form placed in chart (order not valid for inpatient use)     Chief Complaint  Patient presents with   Medical Management of Chronic Issues    Routine [facility] visit    HPI:  Pt is an 85 y.o. female seen today for medical management of chronic diseases.  She is a long-term care resident of [facility].  She is followed by hospice.  She has a PMH of TIA, vascular dementia, essential hypertension and dyslipidemia. She was seen in the room today. She was sleeping but woke up to verbal greetings. She talks but nonsensical. SBPs ranging from 108 to 128. She takes Metoprolol succinate ER 25 mg daily for hypertension. Latest weight 62.6 lbs, down 30.65% o4 27.8 lbs in 93 days. Meal intake is refusing to 25%. She takes Mirtazapine 7.5 mg at bedtime to increase appetite.   Past Medical History:  Diagnosis Date   Cancer ([place])    High  cholesterol    Neuralgia facialis vera    TIA (transient ischemic attack)    Past Surgical History:  Procedure Laterality Date   ANTERIOR APPROACH HEMI HIP ARTHROPLASTY Right 01/15/2020   Procedure: ANTERIOR APPROACH HEMI HIP ARTHROPLASTY;  Surgeon: Leandrew Koyanagi, MD;  Location: [facility];  Service: Orthopedics;  Laterality: Right;    Allergies  Allergen Reactions   Aspirin Nausea And Vomiting   Penicillins Hives   Sulfa Antibiotics Nausea And Vomiting   Sulfamethoprim [Sulfamethoxazole-Trimethoprim] Other (See Comments)    Per [facility]    Outpatient Encounter Medications as of 07/13/2020  Medication Sig   acetaminophen (TYLENOL) 325 MG tablet Take 650 mg by mouth in the morning, at noon, and at bedtime.    acetaminophen (TYLENOL) 500 MG tablet Take 500 mg by mouth 2 (two) times daily as needed for moderate pain.    Cholecalciferol (D3-50) 1.25 MG (50000 UT) capsule Take 50,000 Units by mouth every 30 (thirty) days. On the 2nd of each month   divalproex (DEPAKOTE) 125 MG DR tablet Take 125 mg by mouth 2 (two) times daily. FOR MOOD DISORDER (DO NOT CRUSH)   escitalopram (LEXAPRO) 10 MG tablet Take 10 mg by mouth daily. FOR DEPRESSION   guaiFENesin (ROBITUSSIN) 100 MG/5ML liquid Take 200 mg by mouth 4 (four) times daily as needed for cough or congestion.   lactulose (CHRONULAC) 10 GM/15ML solution Take 20 g by mouth daily. For Constipation   metoprolol succinate (TOPROL-XL) 25 MG 24 hr tablet Take 25 mg by mouth daily.   mirtazapine (REMERON)  7.5 MG tablet Take 7.5 mg by mouth at bedtime.   Nutritional Supplements (NUTRITIONAL SUPPLEMENT PO) Take 120 mLs by mouth 3 (three) times daily. MedPass   Nutritional Supplements (NUTRITIONAL SUPPLEMENT PO) Take 1 each by mouth in the morning, at noon, and at bedtime. Magic Cup   polyvinyl alcohol (LIQUIFILM TEARS) 1.4 % ophthalmic solution Place 2 drops into the right eye in the morning, at noon, and at bedtime.   [DISCONTINUED] alum &  mag hydroxide-simeth (MYLANTA) 200-200-20 MG/5ML suspension Take 30 mLs by mouth every 6 (six) hours as needed for indigestion or heartburn.   [DISCONTINUED] ascorbic acid (VITAMIN C) 500 MG tablet Take 500 mg by mouth 2 (two) times daily.   [DISCONTINUED] ferrous sulfate 325 (65 FE) MG tablet Take 325 mg by mouth daily. FOR ANEMIA   [DISCONTINUED] Multiple Vitamin (MULTIVITAMIN WITH MINERALS) TABS tablet Take 1 tablet by mouth daily.   No facility-administered encounter medications on file as of 07/13/2020.    Review of Systems  Unable to obtain due to dementia    Immunization History  Administered Date(s) Administered   Influenza-Unspecified 05/19/2020   Moderna Sars-Covid-2 Vaccination 09/04/2019, 10/02/2019   Pertinent  Health Maintenance Due  Topic Date Due   DEXA SCAN  Never done   PNA vac Low Risk Adult (1 of 2 - PCV13) Never done   INFLUENZA VACCINE  Completed    Vitals:   07/13/20 1102  BP: 110/78  Pulse: 79  Resp: 19  Temp: 97.6 F (36.4 C)  TempSrc: Oral  Weight: 62 lb 9.6 oz (28.4 kg)  Height: 5\' 1"  (1.549 m)   Body mass index is 11.83 kg/m.  Physical Exam  GENERAL APPEARANCE:  In no acute distress.  SKIN:  Skin is warm and dry.  MOUTH and THROAT: Lips are without lesions. Oral mucosa is moist and without lesions.  RESPIRATORY: Breathing is even & unlabored, BS CTAB CARDIAC: RRR, no murmur,no extra heart sounds, no edema GI: Abdomen soft, normal BS, no masses, no tenderness NEUROLOGICAL: There is no tremor. Speech is clear. Confused X 3. PSYCHIATRIC: Affect and behavior are appropriate  Labs reviewed: Recent Labs    01/24/20 1722 01/25/20 0033 01/25/20 0500 01/30/20 0406 02/09/20 0000 02/21/20 0000  NA 141  --  138 140 144 144  K 3.1*  --  4.4 4.6 3.7 3.6  CL 103  --  106 103 104 100  CO2 29  --  24 27 27* 25*  GLUCOSE 148*  --  91 97  --   --   BUN 27*  --  22 21 37* 29*  CREATININE 0.67  --  0.54 0.61 0.6 0.4*  CALCIUM 8.2*  --   7.3* 9.0 9.7 9.8  MG  --  1.8 1.6* 1.9  --   --   PHOS  --  3.8 2.5  --   --   --    Recent Labs    01/24/20 1722 01/25/20 0500 01/30/20 0406  AST 43* 38 27  ALT 57* 45* 27  ALKPHOS 66 52 61  BILITOT 0.7 1.5* 0.8  PROT 5.5* 4.9* 6.2*  ALBUMIN 2.1* 1.9* 2.6*   Recent Labs    01/24/20 1722 01/25/20 0033 01/25/20 0500 01/25/20 1203 01/25/20 2351 01/26/20 0450 01/30/20 0406 02/09/20 0000  WBC 7.2   < > 8.0   < > 9.4 9.6 8.4 9.0  NEUTROABS 4.6  --  5.2  --   --   --  4.7  --  HGB 7.0*   < > 9.4*   < > 10.3* 12.2 12.3 11.8*  HCT 22.7*   < > 28.5*   < > 31.3* 35.2* 38.5 36  MCV 93.0   < > 88.5   < > 86.7 86.3 91.9  --   PLT 303   < > 232   < > 254 232 254 190   < > = values in this interval not displayed.   Lab Results  Component Value Date   TSH 2.184 01/25/2020   Lab Results  Component Value Date   HGBA1C  09/21/2009    5.8 (NOTE) The ADA recommends the following therapeutic goal for glycemic control related to Hgb A1c measurement: Goal of therapy: <6.5 Hgb A1c  Reference: American Diabetes Association: Clinical Practice Recommendations 2010, Diabetes Care, 2010, 33: (Suppl  1).   Lab Results  Component Value Date   CHOL  09/22/2009    123        ATP III CLASSIFICATION:  <200     mg/dL   Desirable  200-239  mg/dL   Borderline High  >=240    mg/dL   High          HDL 34 (L) 09/22/2009   LDLCALC  09/22/2009    64        Total Cholesterol/HDL:CHD Risk Coronary Heart Disease Risk Table                     Men   Women  1/2 Average Risk   3.4   3.3  Average Risk       5.0   4.4  2 X Average Risk   9.6   7.1  3 X Average Risk  23.4   11.0        Use the calculated Patient Ratio above and the CHD Risk Table to determine the patient's CHD Risk.        ATP III CLASSIFICATION (LDL):  <100     mg/dL   Optimal  100-129  mg/dL   Near or Above                    Optimal  130-159  mg/dL   Borderline  160-189  mg/dL   High  >190     mg/dL   Very High   TRIG 127  09/22/2009   CHOLHDL 3.6 09/22/2009    Assessment/Plan  1. Essential hypertension -  Stable, continue metoprolol succinate ER  2. Poor appetite Wt Readings from Last 3 Encounters:  07/13/20 62 lb 9.6 oz (28.4 kg)  05/30/20 81 lb 9.6 oz (37 kg)  03/07/20 115 lb 9.6 oz (52.4 kg)   - has significant weight loss, continue Magic cup and med Pass for supplementation -Continue mirtazapine  3. Major depression, recurrent, chronic (HCC) -   Mood is stable, continue Escitalopram  4. Mood disorder ([place]) -No reported agitation, continue divalproex DR -Followed by psych NP  5. Dementia with behavioral disturbance, unspecified dementia type ([place]) -Continue supportive care    Family/ staff Communication: Discussed plan of care with charge nurse.  Labs/tests ordered: None  Goals of care:   Long-term care/hospice care    Durenda Age, DNP, MSN, FNP-BC [facility] [phone] (Monday-Friday 8:00 a.m. - 5:00 p.m.) [phone] (after hours)

## 2021-10-27 IMAGING — CR DG FEMUR 2+V*R*
4 series · 4 of 4 positions shown · non-contrast
Comparison: None.

CLINICAL DATA: Status post fall.

EXAM:
RIGHT FEMUR 2 VIEWS

[x femur proximal ap right (1 of 2)]
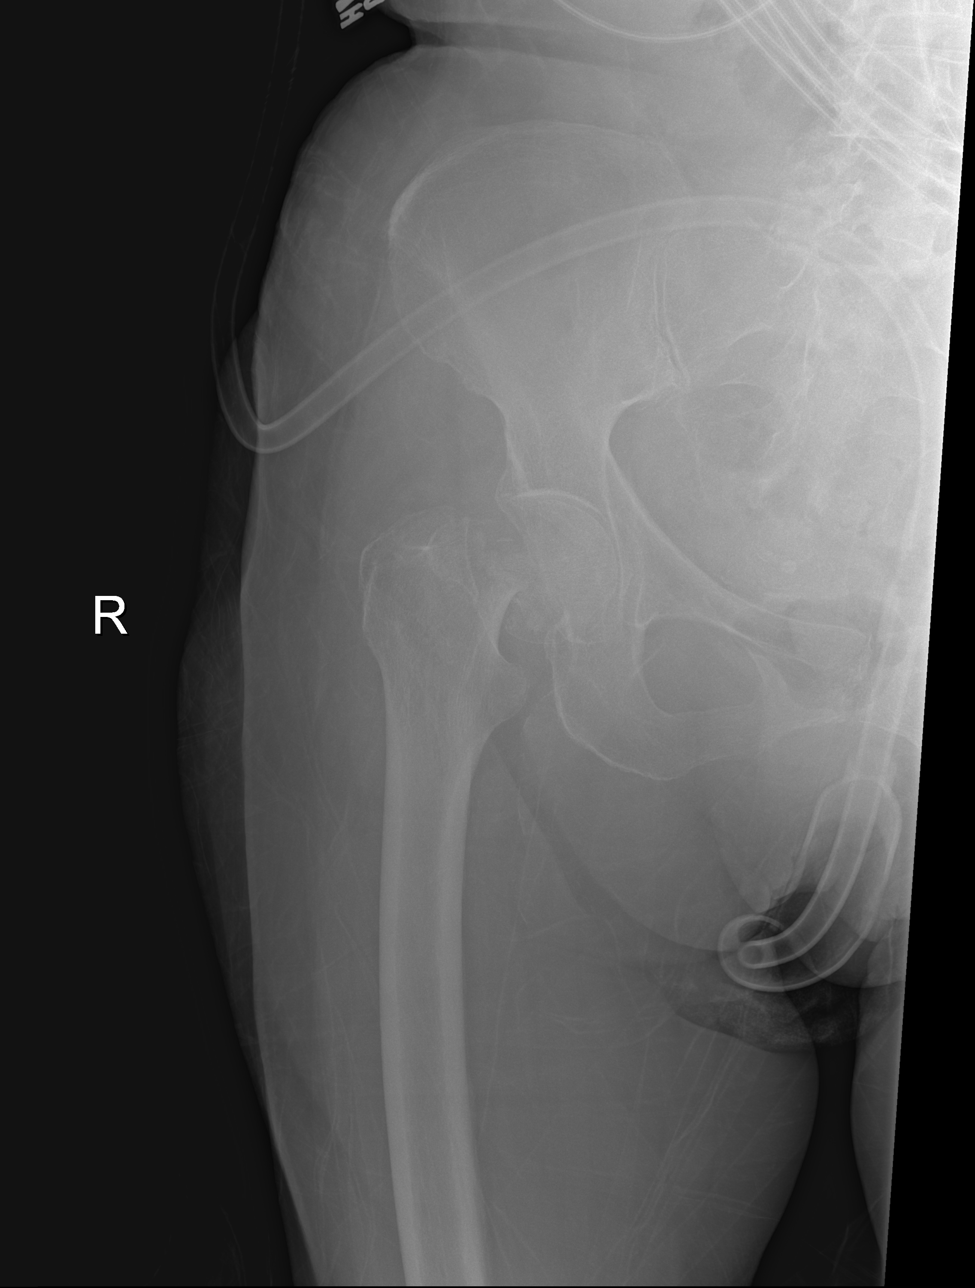

[x femur distal ap right (1 of 2)]
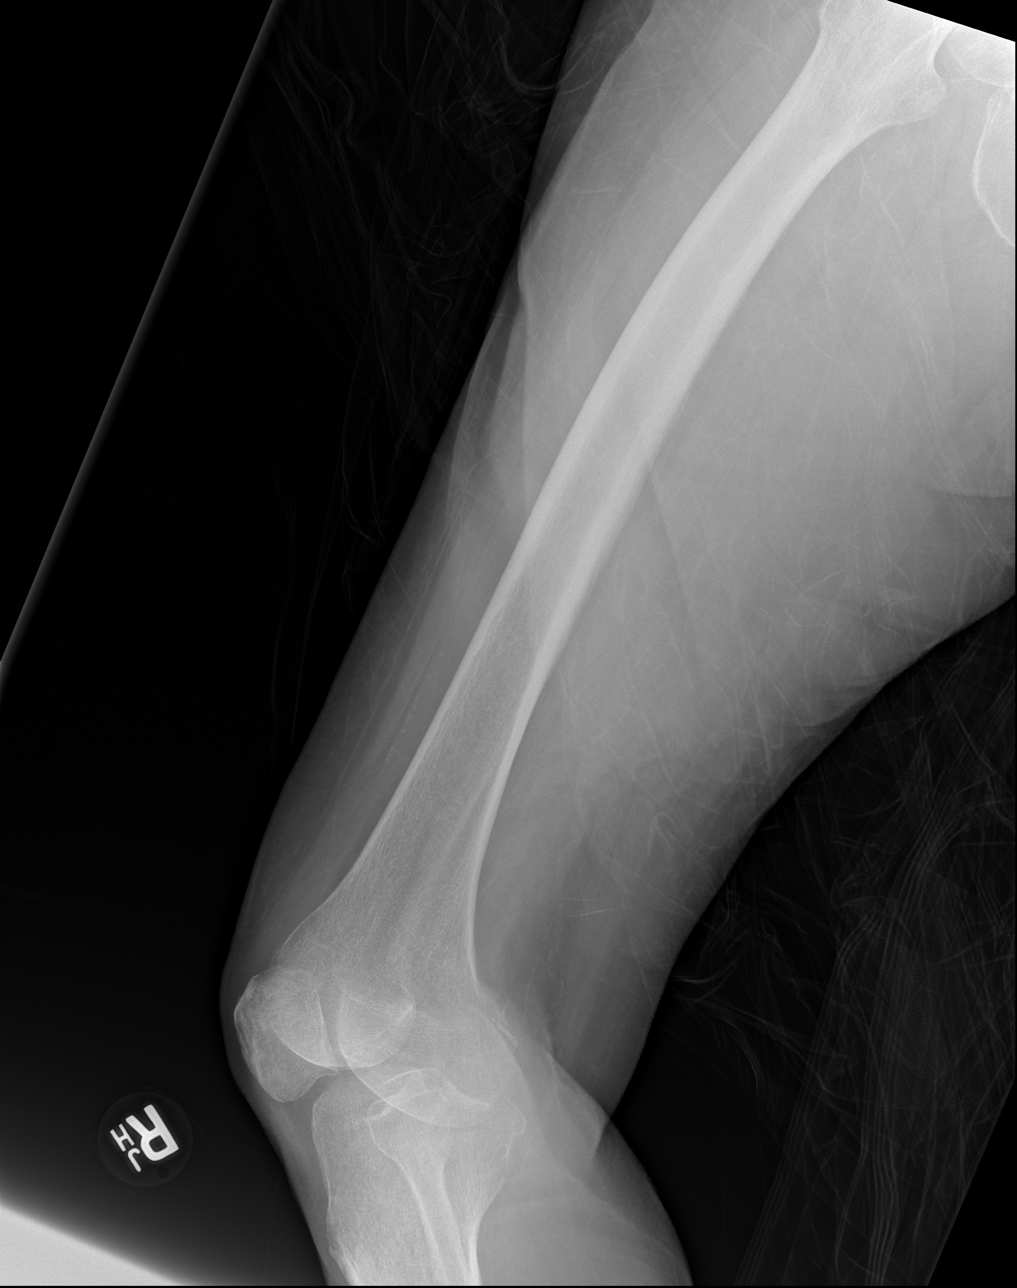

[x femur distal ap right (2 of 2)]
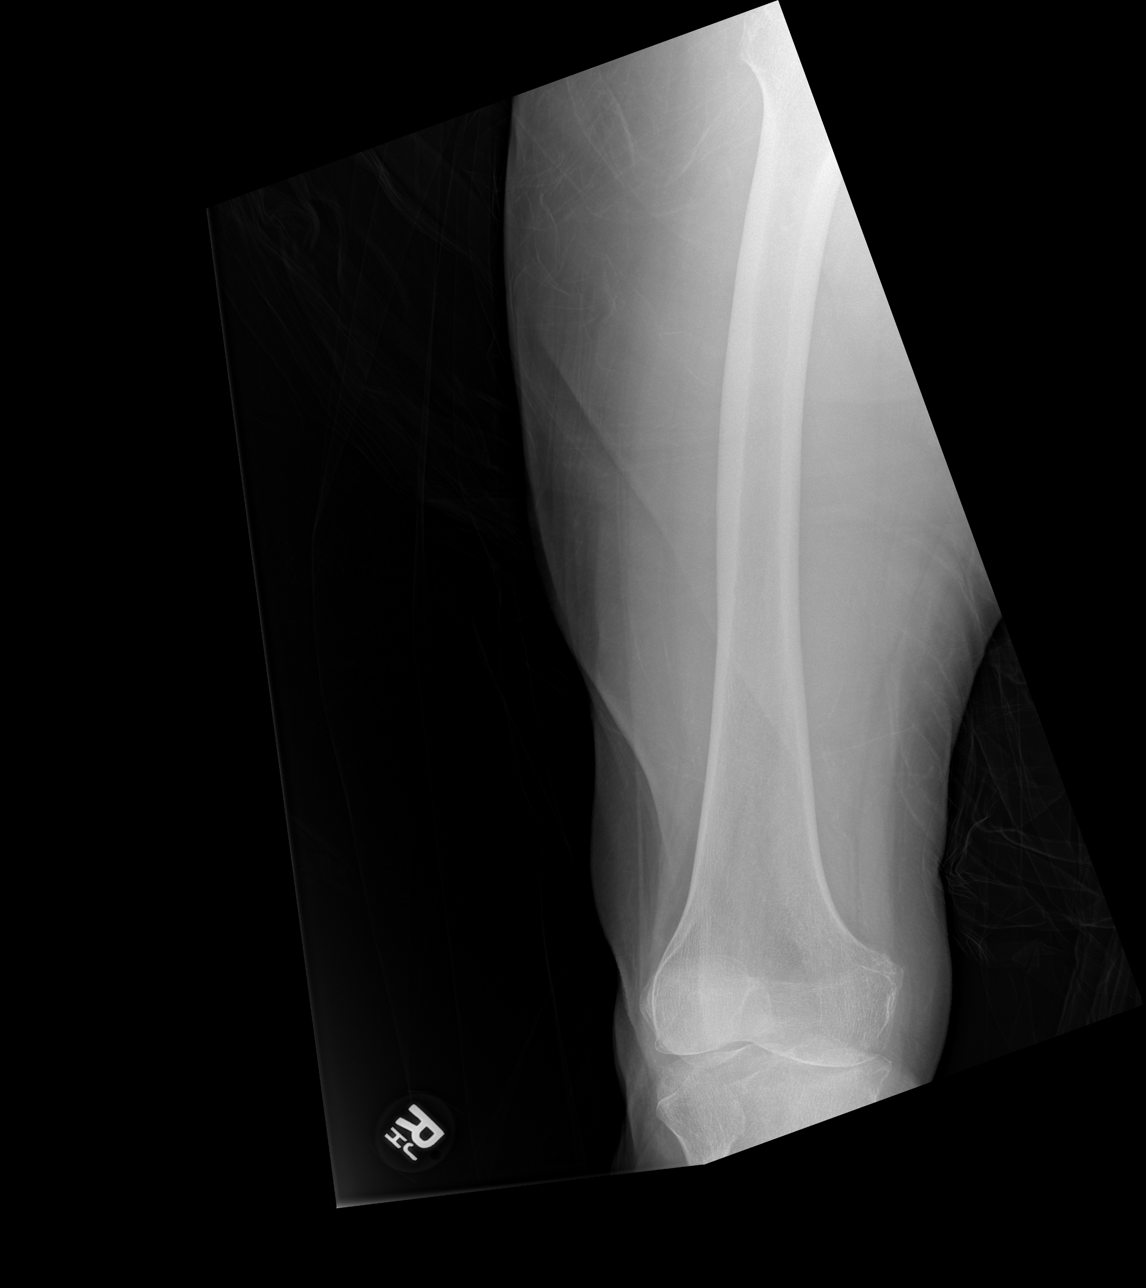

[x femur proximal ap right (2 of 2)]
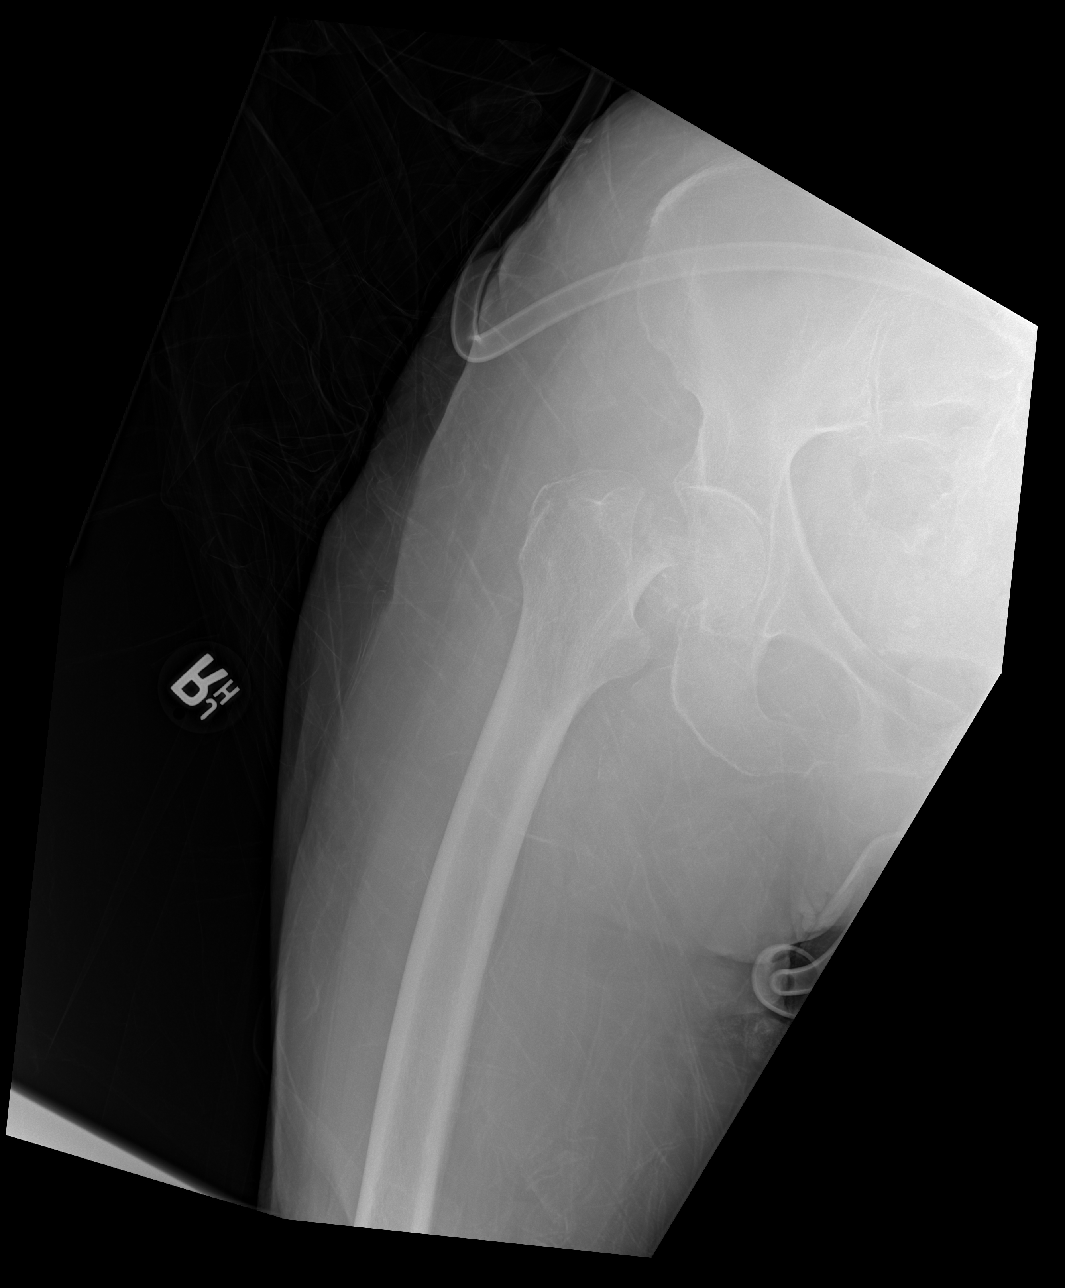

[4 of 4 positions shown; findings below may reference images not displayed]

FINDINGS: Acute fracture deformity is seen extending through the neck of the
proximal right femur. Approximately [DATE] shaft width superior
displacement of the distal fracture site is noted. There is no
evidence of associated dislocation. Soft tissue structures are
unremarkable.
IMPRESSION: Acute fracture of the proximal right femur.

## 2021-10-27 IMAGING — CR DG CHEST 1V
1 series · 1 of 1 positions shown · non-contrast
Comparison: None.

CLINICAL DATA: Status post fall.

EXAM:
CHEST  1 VIEW

[x chest ap]
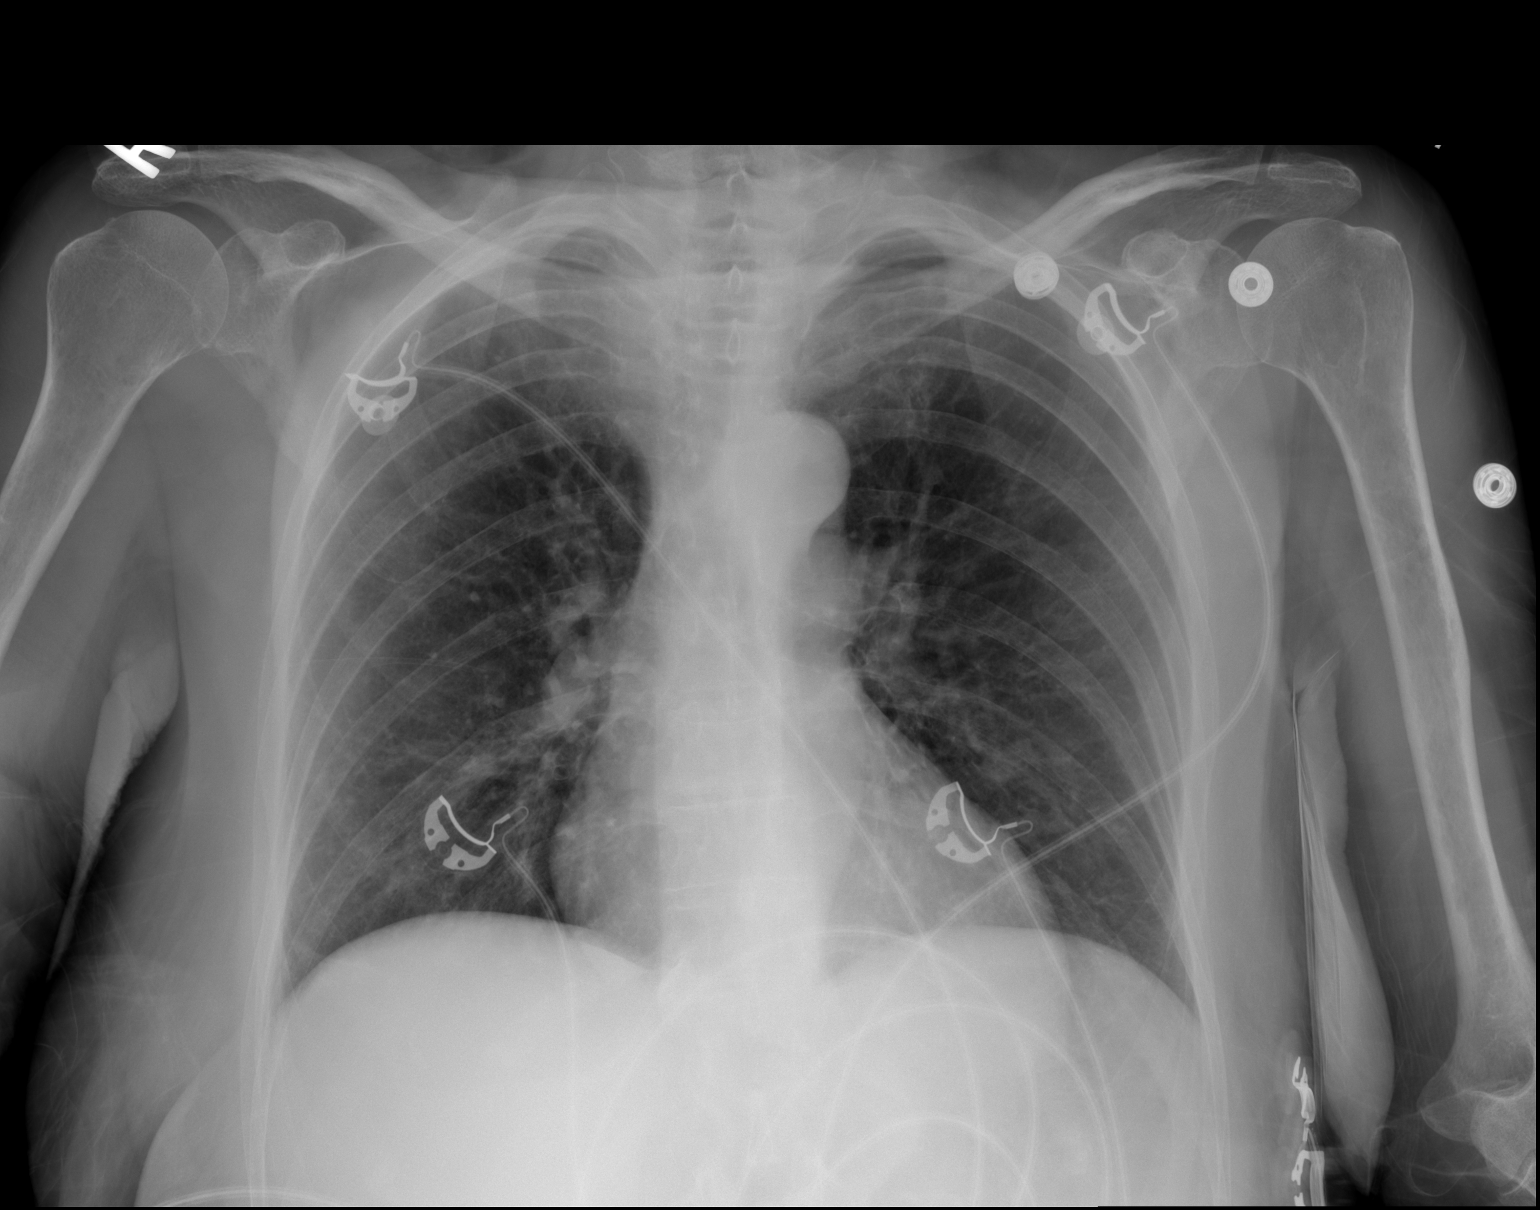

[1 of 1 positions shown; findings below may reference images not displayed]

FINDINGS: There is no evidence of acute infiltrate, pleural effusion or
pneumothorax. The heart size and mediastinal contours are within
normal limits. The visualized skeletal structures are unremarkable.
IMPRESSION: No active disease.
